# Patient Record
Sex: Male | Born: 1975 | Race: White | Hispanic: No | Marital: Married | Smoking: Former smoker
Health system: Southern US, Community
[De-identification: ages and names within clinical notes are randomized; demographics above are authoritative.]

## PROBLEM LIST (undated history)

## (undated) DIAGNOSIS — M069 Rheumatoid arthritis, unspecified: Secondary | ICD-10-CM

## (undated) DIAGNOSIS — J309 Allergic rhinitis, unspecified: Secondary | ICD-10-CM

## (undated) DIAGNOSIS — K219 Gastro-esophageal reflux disease without esophagitis: Secondary | ICD-10-CM

## (undated) DIAGNOSIS — F419 Anxiety disorder, unspecified: Secondary | ICD-10-CM

## (undated) DIAGNOSIS — J45909 Unspecified asthma, uncomplicated: Secondary | ICD-10-CM

## (undated) HISTORY — DX: Unspecified asthma, uncomplicated: J45.909

## (undated) HISTORY — PX: WISDOM TOOTH EXTRACTION: SHX21

## (undated) HISTORY — DX: Rheumatoid arthritis, unspecified: M06.9

## (undated) HISTORY — DX: Allergic rhinitis, unspecified: J30.9

## (undated) HISTORY — DX: Anxiety disorder, unspecified: F41.9

## (undated) HISTORY — DX: Gastro-esophageal reflux disease without esophagitis: K21.9

---

## 2015-07-01 ENCOUNTER — Encounter: Payer: Self-pay | Admitting: Family Medicine

## 2015-07-01 ENCOUNTER — Ambulatory Visit (INDEPENDENT_AMBULATORY_CARE_PROVIDER_SITE_OTHER): Payer: BLUE CROSS/BLUE SHIELD | Admitting: Family Medicine

## 2015-07-01 VITALS — BP 119/83 | HR 71 | Temp 98.3°F | Resp 20 | Ht 72.0 in | Wt 200.2 lb

## 2015-07-01 DIAGNOSIS — Z79620 Long term (current) use of immunosuppressive biologic: Secondary | ICD-10-CM | POA: Insufficient documentation

## 2015-07-01 DIAGNOSIS — J0100 Acute maxillary sinusitis, unspecified: Secondary | ICD-10-CM | POA: Diagnosis not present

## 2015-07-01 DIAGNOSIS — Z7189 Other specified counseling: Secondary | ICD-10-CM

## 2015-07-01 DIAGNOSIS — Z79899 Other long term (current) drug therapy: Secondary | ICD-10-CM

## 2015-07-01 DIAGNOSIS — Z7689 Persons encountering health services in other specified circumstances: Secondary | ICD-10-CM | POA: Insufficient documentation

## 2015-07-01 MED ORDER — AMOXICILLIN-POT CLAVULANATE 875-125 MG PO TABS: 1.0000 | ORAL_TABLET | Freq: Two times a day (BID) | ORAL | Status: DC

## 2015-07-01 NOTE — Patient Instructions (Signed)
Sinusitis, Adult Sinusitis is redness, soreness, and inflammation of the paranasal sinuses. Paranasal sinuses are air pockets within the bones of your face. They are located beneath your eyes, in the middle of your forehead, and above your eyes. In healthy paranasal sinuses, mucus is able to drain out, and air is able to circulate through them by way of your nose. However, when your paranasal sinuses are inflamed, mucus and air can become trapped. This can allow bacteria and other germs to grow and cause infection. Sinusitis can develop quickly and last only a short time (acute) or continue over a long period (chronic). Sinusitis that lasts for more than 12 weeks is considered chronic. CAUSES Causes of sinusitis include:  Allergies.  Structural abnormalities, such as displacement of the cartilage that separates your nostrils (deviated septum), which can decrease the air flow through your nose and sinuses and affect sinus drainage.  Functional abnormalities, such as when the small hairs (cilia) that line your sinuses and help remove mucus do not work properly or are not present. SIGNS AND SYMPTOMS Symptoms of acute and chronic sinusitis are the same. The primary symptoms are pain and pressure around the affected sinuses. Other symptoms include:  Upper toothache.  Earache.  Headache.  Bad breath.  Decreased sense of smell and taste.  A cough, which worsens when you are lying flat.  Fatigue.  Fever.  Thick drainage from your nose, which often is green and may contain pus (purulent).  Swelling and warmth over the affected sinuses. DIAGNOSIS Your health care provider will perform a physical exam. During your exam, your health care provider may perform any of the following to help determine if you have acute sinusitis or chronic sinusitis:  Look in your nose for signs of abnormal growths in your nostrils (nasal polyps).  Tap over the affected sinus to check for signs of  infection.  View the inside of your sinuses using an imaging device that has a light attached (endoscope). If your health care provider suspects that you have chronic sinusitis, one or more of the following tests may be recommended:  Allergy tests.  Nasal culture. A sample of mucus is taken from your nose, sent to a lab, and screened for bacteria.  Nasal cytology. A sample of mucus is taken from your nose and examined by your health care provider to determine if your sinusitis is related to an allergy. TREATMENT Most cases of acute sinusitis are related to a viral infection and will resolve on their own within 10 days. Sometimes, medicines are prescribed to help relieve symptoms of both acute and chronic sinusitis. These may include pain medicines, decongestants, nasal steroid sprays, or saline sprays. However, for sinusitis related to a bacterial infection, your health care provider will prescribe antibiotic medicines. These are medicines that will help kill the bacteria causing the infection. Rarely, sinusitis is caused by a fungal infection. In these cases, your health care provider will prescribe antifungal medicine. For some cases of chronic sinusitis, surgery is needed. Generally, these are cases in which sinusitis recurs more than 3 times per year, despite other treatments. HOME CARE INSTRUCTIONS  Drink plenty of water. Water helps thin the mucus so your sinuses can drain more easily.  Use a humidifier.  Inhale steam 3-4 times a day (for example, sit in the bathroom with the shower running).  Apply a warm, moist washcloth to your face 3-4 times a day, or as directed by your health care provider.  Use saline nasal sprays to help   moisten and clean your sinuses.  Take medicines only as directed by your health care provider.  If you were prescribed either an antibiotic or antifungal medicine, finish it all even if you start to feel better. SEEK IMMEDIATE MEDICAL CARE IF:  You have  increasing pain or severe headaches.  You have nausea, vomiting, or drowsiness.  You have swelling around your face.  You have vision problems.  You have a stiff neck.  You have difficulty breathing.   This information is not intended to replace advice given to you by your health care provider. Make sure you discuss any questions you have with your health care provider.   Document Released: 04/30/2005 Document Revised: 05/21/2014 Document Reviewed: 05/15/2011 Elsevier Interactive Patient Education 2016 [employer]. flonase daily, Careers adviser daily.  Mucinex augmentin

## 2015-07-01 NOTE — Progress Notes (Signed)
Patient ID: Bruce Knight, male   DOB: 1975/06/13, 40 y.o.   MRN: [id number]    Bruce Knight , January 27, 1976, 40 y.o., male MRN: [id number]  CC: Facial pressure/headache Pt to establish care with acute complaint. No records available prior to appt.   Subjective: Pt presents for an acute OV with complaints of headache, facial pressure, nasal congestion, rhinorrhea and sore throat of 4-5 days in duration. He is tolerating food and drink. He does have a decreased appetite. He denies cough, nausea, vomit, diarrhea, fevers or chills. he is on Flonase and Allegra-D. He states he feels like his symptoms are worsening. Patient is on Humira for rheumatoid arthritis. His last injection was today. He denies asthma history. Unknown vaccination record.  Allergies  Allergen Reactions  . Codeine Other (See Comments)    childhood  . Sulfa Antibiotics Other (See Comments)    childhood allergy   Social History  Substance Use Topics  . Smoking status: Never Smoker   . Smokeless tobacco: Not on file  . Alcohol Use: 1.2 oz/week    2 Cans of beer per week   Past Medical History  Diagnosis Date  . Allergy   . Arthritis    Past Surgical History  Procedure Laterality Date  . Wisdom tooth extraction     Family History  Problem Relation Age of Onset  . Arthritis Mother      Medication List       This list is accurate as of: 07/01/15  2:23 PM.  Always use your most recent med list.               diclofenac 75 MG EC tablet  Commonly known as:  VOLTAREN  TK 1 T PO BID     HUMIRA PEN Bruce Knight  Inject into the skin every 14 (fourteen) days.     omeprazole 40 MG capsule  Commonly known as:  PRILOSEC  TK ONE C PO QAM ONE HALF HOUR BEFORE BREAKFAST       ROS: Negative, with the exception of above mentioned in HPI  Objective:  BP 119/83 mmHg  Pulse 71  Temp(Src) 98.3 F (36.8 C)  Resp 20  Ht 6' (1.829 m)  Wt 200 lb 4 oz (90.833 kg)  BMI 27.15 kg/m2  SpO2 96% Body mass index is 27.15  kg/(m^2). Gen: Afebrile. No acute distress. Nontoxic in appearance, well-developed, well-nourished, Caucasian male. Pleasant. HENT: AT. [place]. Bilateral TM visualized no erythema or bulging. MMM, no oral lesions. Bilateral nares with erythema and swelling. Throat without erythema or exudates. Cobblestoning present. No cough on exam. No hoarseness on exam. Mild tenderness to palpation left maxillary sinus. Eyes:Pupils Equal Round Reactive to light, Extraocular movements intact,  Conjunctiva without redness, discharge or icterus. Neck/lymp/endocrine: Supple, no lymphadenopathy CV: RRR  Chest: CTAB, no wheeze or crackles. Good air movement, normal resp effort.  Abd: Soft. NTND. BS present. Skin: No rashes, purpura or petechiae.  Neuro: Normal gait. PERLA. EOMi. Alert. Oriented x3   Assessment/Plan: Bruce Knight is a 40 y.o. male present for establish care and acute complaint. 1. Acute maxillary sinusitis, recurrence not specified - on Humira, last dose yesterday - flonase and plain Allegra continue daily. Add Mucinex - amoxicillin-clavulanate (AUGMENTIN) 875-125 MG tablet; Take 1 tablet by mouth 2 (two) times daily.  Dispense: 20 tablet; Refill: 0 - F/U PRN for acute illness, will need physical within 3 months. - Records have been requested  Felix Pacini, DO  [facility]- OR

## 2015-07-05 ENCOUNTER — Encounter: Payer: Self-pay | Admitting: Family Medicine

## 2015-07-05 DIAGNOSIS — M069 Rheumatoid arthritis, unspecified: Secondary | ICD-10-CM | POA: Insufficient documentation

## 2015-07-13 ENCOUNTER — Encounter: Payer: Self-pay | Admitting: Family Medicine

## 2015-07-13 ENCOUNTER — Other Ambulatory Visit: Payer: Self-pay | Admitting: Family Medicine

## 2015-09-30 ENCOUNTER — Encounter: Payer: Self-pay | Admitting: Family Medicine

## 2015-09-30 DIAGNOSIS — E663 Overweight: Secondary | ICD-10-CM | POA: Insufficient documentation

## 2015-09-30 DIAGNOSIS — K219 Gastro-esophageal reflux disease without esophagitis: Secondary | ICD-10-CM | POA: Insufficient documentation

## 2015-12-09 ENCOUNTER — Encounter: Payer: Self-pay | Admitting: Family Medicine

## 2015-12-09 ENCOUNTER — Ambulatory Visit (INDEPENDENT_AMBULATORY_CARE_PROVIDER_SITE_OTHER): Payer: BLUE CROSS/BLUE SHIELD | Admitting: Family Medicine

## 2015-12-09 VITALS — BP 123/83 | HR 56 | Temp 98.0°F | Resp 20 | Ht 72.0 in | Wt 201.8 lb

## 2015-12-09 DIAGNOSIS — K625 Hemorrhage of anus and rectum: Secondary | ICD-10-CM | POA: Diagnosis not present

## 2015-12-09 DIAGNOSIS — K648 Other hemorrhoids: Secondary | ICD-10-CM | POA: Diagnosis not present

## 2015-12-09 DIAGNOSIS — K602 Anal fissure, unspecified: Secondary | ICD-10-CM

## 2015-12-09 LAB — POC HEMOCCULT BLD/STL (OFFICE/1-CARD/DIAGNOSTIC): Fecal Occult Blood, POC: POSITIVE — AB

## 2015-12-09 MED ORDER — HYDROCORTISONE ACETATE 25 MG RE SUPP: 25.0000 mg | Freq: Two times a day (BID) | RECTAL | 0 refills | Status: DC

## 2015-12-09 MED ORDER — HYDROCORTISONE 2.5 % RE CREA: 1.0000 "application " | TOPICAL_CREAM | Freq: Two times a day (BID) | RECTAL | 0 refills | Status: DC

## 2015-12-09 NOTE — Progress Notes (Signed)
Bruce Knight , [birth year], 40 y.o., male MRN: [id number] Patient Care Team    Relationship Specialty Notifications Start End  Natalia Leatherwood, DO PCP - General Family Medicine  07/01/15   Fuller Mandril, MD Referring Physician Rheumatology  07/01/15     CC: rectal bleeding Subjective: Pt presents for an acute OV with complaints of rectal bleeding of 3 days duration.  Associated symptoms include constipation and mildly painful stools. He states 2-3 months ago he noticed a discomfort with passing stool and  small amount bright red blood on toilet paper. He has been placing triple abx ointment on the area. He denies fever, chills, abd pain, discomfort sitting or unintentional weight loss. He denies stool discoloration/blood. Patients has a h/o rheumatoid arthritis and is prescribed humira. He is prescribed  diclofenac as needed. No Fhx of colon cancer or disease. He states he has be working out more and drinking a protein powder.   Allergies  Allergen Reactions  . Codeine Other (See Comments)    childhood  . Latex   . Sulfa Antibiotics Other (See Comments)    childhood allergy   Social History  Substance Use Topics  . Smoking status: Former Games developer  . Smokeless tobacco: Never Used  . Alcohol use 1.2 oz/week    2 Cans of beer per week   Past Medical History:  Diagnosis Date  . Allergic rhinitis   . Allergy   . Anxiety   . Asthma   . GERD (gastroesophageal reflux disease)   . Rheumatoid arthritis [facility])    Past Surgical History:  Procedure Laterality Date  . WISDOM TOOTH EXTRACTION     Family History  Problem Relation Age of Onset  . Arthritis Mother   . Colon cancer Neg Hx   . Prostate cancer Neg Hx      Medication List       Accurate as of 12/09/15  8:42 AM. Always use your most recent med list.          diclofenac 75 MG EC tablet Commonly known as:  VOLTAREN TK 1 T PO BID   HUMIRA PEN [place] Inject into the skin every 14 (fourteen) days.   omeprazole 40 MG  capsule Commonly known as:  PRILOSEC TK ONE C PO QAM ONE HALF HOUR BEFORE BREAKFAST       No results found for this or any previous visit (from the past 24 hour(s)). No results found.   ROS: Negative, with the exception of above mentioned in HPI   Objective:  BP 123/83 (BP Location: Right Arm, Patient Position: Sitting, Cuff Size: Large)   Pulse (!) 56   Temp 98 F (36.7 C) (Oral)   Resp 20   Ht 6' (1.829 m)   Wt 201 lb 12 oz (91.5 kg)   SpO2 97%   BMI 27.36 kg/m  Body mass index is 27.36 kg/m. Gen: Afebrile. No acute distress. Nontoxic in appearance, well developed, well nourished. Pleasant caucasian male.  HENT: AT. [place]. MMM, no oral lesions. Eyes:Pupils Equal Round Reactive to light, Extraocular movements intact,  Conjunctiva without redness, discharge or icterus. GU: Mild erythema and irritation perianal region. No external mass/hemorrhoid, small skin break, possible small fissure anteriorly, normal anal tone, small internal hemorrhoid present. No obvious rectal masses.  FOBT: positive  Assessment/Plan: Bruce Knight is a 40 y.o. male present for acute OV for rectal bleeding.   Rectal bleeding/internal hemorrhoid/anal fissure - POC Hemoccult Bld/Stl (1-Cd Office Dx): Positive - Discussed hemorrhoid treatment,  sitz bath, increase fiber 20-35 g daily. Increase water consumption to 80-100 ounces daily, more if working out/sweating. Miralax use to soften stools, without causing watery stools or diarrhea.  - Avoidance of NSAIDS - hydrocortisone (ANUSOL-HC) 25 MG suppository; Place 1 suppository (25 mg total) rectally 2 (two) times daily.  Dispense: 12 suppository; Refill: 0 - hydrocortisone (ANUSOL-HC) 2.5 % rectal cream; Place 1 application rectally 2 (two) times daily.  Dispense: 30 g; Refill: 0 - If bleeding is not resolved, pt is to make an appt. R/O IBD as cause. Consider GI referral with RA h/o concern for IBD development.   electronically signed by:  Felix Pacini, DO   [facility] - OR

## 2015-12-09 NOTE — Patient Instructions (Addendum)
About Hemorrhoids  Hemorrhoids are swollen veins in the lower rectum and anus.  Also called piles, hemorrhoids are a common problem.  Hemorrhoids may be internal (inside the rectum) or external (around the anus).  Internal Hemorrhoids  Internal hemorrhoids are often painless, but they rarely cause bleeding.  The internal veins may stretch and fall down (prolapse) through the anus to the outside of the body.  The veins may then become irritated and painful.  External Hemorrhoids  External hemorrhoids can be easily seen or felt around the anal opening.  They are under the skin around the anus.  When the swollen veins are scratched or broken by straining, rubbing or wiping they sometimes bleed.  How Hemorrhoids Occur  Veins in the rectum and around the anus tend to swell under pressure.  Hemorrhoids can result from increased pressure in the veins of your anus or rectum.  Some sources of pressure are:   Straining to have a bowel movement because of constipation  Waiting too long to have a bowel movement  Coughing and sneezing often  Sitting for extended periods of time, including on the toilet  Diarrhea  Obesity  Trauma or injury to the anus  Some liver diseases  Stress  Family history of hemorrhoids  Pregnancy  Pregnant women should try to avoid becoming constipated, because they are more likely to have hemorrhoids during pregnancy.  In the last trimester of pregnancy, the enlarged uterus may press on blood vessels and causes hemorrhoids.  In addition, the strain of childbirth sometimes causes hemorrhoids after the birth.  Symptoms of Hemorrhoids  Some symptoms of hemorrhoids include:  Swelling and/or a tender lump around the anus  Itching, mild burning and bleeding around the anus  Painful bowel movements with or without constipation  Bright red blood covering the stool, on toilet paper or in the toilet bowel.   Symptoms usually go away within a few days.  Always  talk to your doctor about any bleeding to make sure it is not from some other causes.  Diagnosing and Treating Hemorrhoids  Diagnosis is made by an examination by your healthcare provider.  Special test can be performed by your doctor.    Most cases of hemorrhoids can be treated with:  High-fiber diet: Eat more high-fiber foods, which help prevent constipation.  Ask for more detailed fiber information on types and sources of fiber from your healthcare provider.  Fluids: Drink plenty of water.  This helps soften bowel movements so they are easier to pass.  Sitz baths and cold packs: Sitting in lukewarm water two or three times a day for 15 minutes cleases the anal area and may relieve discomfort.  If the water is too hot, swelling around the anus will get worse.  Placing a cloth-covered ice pack on the anus for ten minutes four times a day can also help reduce selling.  Gently pushing a prolapsed hemorrhoid back inside after the bath or ice pack can be helpful.  Medications: For mild discomfort, your healthcare provider may suggest over-the-counter pain medication or prescribe a cream or ointment for topical use.  The cream may contain witch hazel, zinc oxide or petroleum jelly.  Medicated suppositories are also a treatment option.  Always consult your doctor before applying medications or creams.  Procedures and surgeries: There are also a number of procedures and surgeries to shrink or remove hemorrhoids in more serious cases.  Talk to your physician about these options.  You can often prevent hemorrhoids or keep   them from becoming worse by maintaining a healthy lifestyle.  Eat a fiber-rich diet of fruits, vegetables and whole grains.  Also, drink plenty of water and exercise regularly.   2007, [employer] Doc.30  Increase your Water consumption, at least 80 -100 ounces a day.  Look at picking up a sitz bath at pharmacy to help with cleaning.  Stop antibiotic cream  No  NSAIDS Increase fiber to 20-35 g daily.  Use miralax 1 cap with 8 ounces of water daily to soften stool, without causing loose stool .

## 2016-06-29 ENCOUNTER — Telehealth: Payer: Self-pay | Admitting: Family Medicine

## 2016-06-29 ENCOUNTER — Encounter: Payer: Self-pay | Admitting: *Deleted

## 2016-06-29 NOTE — Telephone Encounter (Signed)
Do you want to refill his omeprazole and diclofenac we have never Rx'd these medications and he has requested to change to another PCP Please advise

## 2016-06-29 NOTE — Telephone Encounter (Signed)
Patient is calling to request to switch PCP's.   He would like to change from Dr. Felix Pacini at [facility] to Dr. Helane Rima at [facility].  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 Earlene Plater.  Please respond at your earliest  convenience to acknowledge the patients request.  Thank you,  -LL

## 2016-06-29 NOTE — Telephone Encounter (Signed)
Message sent in MyChart.

## 2016-06-29 NOTE — Telephone Encounter (Signed)
It ok with me. I am not certain if Dr. Earlene Plater performs DOTC physicals though. He may want to check to make sure.

## 2016-06-29 NOTE — Telephone Encounter (Signed)
I have never prescribed these medicines for him prior. He should contact the provider that prescribed the meds for him.

## 2016-06-29 NOTE — Telephone Encounter (Signed)
**  Remind patient they can make refill requests via MyChart**  Medication refill request (Name & Dosage): omeprozole 40 mg and diclofenac 75 mg   Preferred [facility] ([facility] & [facility] [address]     Other comments (if applicable):

## 2016-06-29 NOTE — Telephone Encounter (Signed)
Okay to transfer. 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

## 2016-07-06 ENCOUNTER — Encounter: Payer: Self-pay | Admitting: Family Medicine

## 2016-07-06 ENCOUNTER — Ambulatory Visit (INDEPENDENT_AMBULATORY_CARE_PROVIDER_SITE_OTHER): Payer: BLUE CROSS/BLUE SHIELD | Admitting: Family Medicine

## 2016-07-06 VITALS — BP 136/78 | HR 62 | Temp 98.5°F | Ht 71.0 in | Wt 202.2 lb

## 2016-07-06 DIAGNOSIS — M05741 Rheumatoid arthritis with rheumatoid factor of right hand without organ or systems involvement: Secondary | ICD-10-CM

## 2016-07-06 DIAGNOSIS — K219 Gastro-esophageal reflux disease without esophagitis: Secondary | ICD-10-CM

## 2016-07-06 DIAGNOSIS — M05742 Rheumatoid arthritis with rheumatoid factor of left hand without organ or systems involvement: Secondary | ICD-10-CM

## 2016-07-06 DIAGNOSIS — L720 Epidermal cyst: Secondary | ICD-10-CM | POA: Diagnosis not present

## 2016-07-06 DIAGNOSIS — E663 Overweight: Secondary | ICD-10-CM | POA: Diagnosis not present

## 2016-07-06 MED ORDER — DICLOFENAC SODIUM 75 MG PO TBEC: 75.0000 mg | DELAYED_RELEASE_TABLET | Freq: Two times a day (BID) | ORAL | 3 refills | Status: DC

## 2016-07-06 MED ORDER — OMEPRAZOLE 40 MG PO CPDR: 40.0000 mg | DELAYED_RELEASE_CAPSULE | Freq: Every day | ORAL | 3 refills | Status: DC

## 2016-07-06 NOTE — Progress Notes (Signed)
Bruce Knight is a 41 y.o. male is here to [facility] C[facility].   History of Present Illness:    1. Rheumatoid arthritis involving both hands with positive rheumatoid factor (HCC). Followed by Rheumatology. Doing well on Humira and Diclofenac.     2. Gastroesophageal reflux disease without esophagitis. Doing well on Prilosec. Gastrointestinal ROS: no abdominal pain, change in bowel habits, or black or bloody stools.    3. Overweight (BMI 25.0-29.9).   Wt Readings from Last 3 Encounters:  07/06/16 202 lb 3.2 oz (91.7 kg)  12/09/15 201 lb 12 oz (91.5 kg)  07/01/15 200 lb 4 oz (90.8 kg)    4.      Cyst. On back. No change in 2 years. No symptoms. Wants checked.   There are no preventive care reminders to display for this patient.  PMHx, SurgHx, SocialHx, Medications, and Allergies were reviewed in the Visit Navigator and updated as appropriate.    Past Medical History:  Diagnosis Date  . Allergic rhinitis   . Anxiety   . Asthma   . GERD (gastroesophageal reflux disease)   . Rheumatoid arthritis [facility])     Past Surgical History:  Procedure Laterality Date  . WISDOM TOOTH EXTRACTION      Family History  Problem Relation Age of Onset  . Arthritis Mother   . Prostate cancer Father   . Colon cancer Neg Hx     Social History  Substance Use Topics  . Smoking status: Former Games developer  . Smokeless tobacco: Never Used  . Alcohol use 1.2 oz/week    2 Cans of beer per week     Comment: Socially     Current Medications and Allergies:    Current Outpatient Prescriptions:  .  Adalimumab (HUMIRA PEN [place]), Inject into the skin every 14 (fourteen) days., Disp: , Rfl:  .  diclofenac (VOLTAREN) 75 MG EC tablet, Take 1 tablet (75 mg total) by mouth 2 (two) times daily., Disp: 180 tablet, Rfl: 3 .  omeprazole (PRILOSEC) 40 MG capsule, Take 1 capsule (40 mg total) by mouth daily., Disp: 90 capsule, Rfl: 3   Allergies  Allergen Reactions  . Codeine Other (See Comments)    childhood    . Latex   . Sulfa Antibiotics Other (See Comments)    childhood allergy      Patient Information Form: Screening and ROS    Do you feel safe in relationships? yes PHQ-2: negative  Review of Systems  General:  Negative for unexplained weight loss, fever Skin: Negative for new or changing mole, sore that won't heal HEENT: Negative for trouble hearing, trouble seeing, ringing in ears, mouth sores, hoarseness, change in voice, dysphagia CV:  Negative for chest pain, dyspnea, edema, palpitations Resp: Negative for cough, dyspnea, hemoptysis GI: Negative for nausea, vomiting, diarrhea, constipation, abdominal pain, melena, hematochezia GU: Negative for dysuria, incontinence, urinary hesitance, hematuria, vaginal or penile discharge, polyuria, sexual difficulty, lumps in testicle or breasts MSK: Negative for muscle cramps or aches Neuro: Negative for headaches, weakness, numbness, dizziness, passing out/fainting Psych: Negative for depression, anxiety, memory problems   Vitals:   Vitals:   07/06/16 0818  BP: 136/78  Pulse: 62  Temp: 98.5 F (36.9 C)  TempSrc: Oral  SpO2: 97%  Weight: 202 lb 3.2 oz (91.7 kg)  Height: 5\' 11"  (1.803 m)     Body mass index is 28.2 kg/m.   Physical Exam:     General: Alert, cooperative, appears stated age and no distress.  HEENT:  Normocephalic, without obvious abnormality, atraumatic. Conjunctivae/corneas clear. PERRL, EOM's intact. Normal TM's and external ear canals both ears. Nares normal. Septum midline. Mucosa normal. No drainage or sinus tenderness. Lips, mucosa, and tongue normal; teeth and gums normal.  Lungs: Clear to auscultation bilaterally.  Heart:: Regular rate and rhythm, S1, S2 normal, no murmur, click, rub or gallop.  Abdomen: Soft, non-tender; bowel sounds normal; no masses,  no organomegaly.  Extremities: Extremities normal, atraumatic, no cyanosis or edema.  Pulses: 2+ and symmetric.  Skin: Skin color, texture, turgor  normal. 9 mm diameter, flesh-colored, mobile, non-tender, cyst.  Neurologic: Alert and oriented X 3, normal strength and tone. Normal symmetric. reflexes. Normal coordination and gait.  Psych: Alert,oriented, in NAD with a full range of affect, normal behavior and no psychotic features      Assessment and Plan:    Bruce Knight was seen today for establish care and medication refill.  Diagnoses and all orders for this visit:  Rheumatoid arthritis involving both hands with positive rheumatoid factor (HCC) -     diclofenac (VOLTAREN) 75 MG EC tablet; Take 1 tablet (75 mg total) by mouth 2 (two) times daily.  Gastroesophageal reflux disease without esophagitis -     omeprazole (PRILOSEC) 40 MG capsule; Take 1 capsule (40 mg total) by mouth daily.  Overweight (BMI 25.0-29.9) Comments: The patient is asked to make an attempt to improve diet and exercise patterns.  Epidermoid cyst       -      Benign-appearing today. Red flags reviewed.  . Reviewed expectations re: course of current medical issues. . Discussed self-management of symptoms. . Outlined signs and symptoms indicating need for more acute intervention. . Patient verbalized understanding and all questions were answered. . See orders for this visit as documented in the electronic medical record. . Patient received an After Visit Summary.  Records requested if needed. I spent 45 minutes with this patient, greater than 50% was face-to-face time counseling regarding the above diagnoses.    Helane Rima, D.O. [place] 07/06/2016   Follow-up: No Follow-up on file.  Meds ordered this encounter  Medications  . diclofenac (VOLTAREN) 75 MG EC tablet    Sig: Take 1 tablet (75 mg total) by mouth 2 (two) times daily.    Dispense:  180 tablet    Refill:  3  . omeprazole (PRILOSEC) 40 MG capsule    Sig: Take 1 capsule (40 mg total) by mouth daily.    Dispense:  90 capsule    Refill:  3   Medications Discontinued  During This Encounter  Medication Reason  . diclofenac (VOLTAREN) 75 MG EC tablet Reorder  . omeprazole (PRILOSEC) 40 MG capsule Reorder   No orders of the defined types were placed in this encounter.

## 2016-07-06 NOTE — Progress Notes (Signed)
Pre visit review using our clinic review tool, if applicable. No additional management support is needed unless otherwise documented below in the visit note. 

## 2017-01-30 ENCOUNTER — Telehealth: Payer: Self-pay | Admitting: Family Medicine

## 2017-01-30 NOTE — Telephone Encounter (Signed)
Sched acute visit w/ Earlene Plater tomorrow at 8:15am for weakness, lightheaded (not dizzy), nose running, sinus pressure.  -LL

## 2017-01-31 ENCOUNTER — Ambulatory Visit (INDEPENDENT_AMBULATORY_CARE_PROVIDER_SITE_OTHER): Payer: BLUE CROSS/BLUE SHIELD | Admitting: Family Medicine

## 2017-01-31 ENCOUNTER — Encounter: Payer: Self-pay | Admitting: Family Medicine

## 2017-01-31 VITALS — BP 132/78 | HR 61 | Temp 98.3°F | Ht 71.0 in | Wt 200.8 lb

## 2017-01-31 DIAGNOSIS — J208 Acute bronchitis due to other specified organisms: Secondary | ICD-10-CM | POA: Diagnosis not present

## 2017-01-31 DIAGNOSIS — J0100 Acute maxillary sinusitis, unspecified: Secondary | ICD-10-CM

## 2017-01-31 DIAGNOSIS — B9689 Other specified bacterial agents as the cause of diseases classified elsewhere: Secondary | ICD-10-CM

## 2017-01-31 MED ORDER — PREDNISONE 5 MG PO TABS
ORAL_TABLET | ORAL | 0 refills | Status: DC
Start: 2017-01-31 — End: 2017-10-22

## 2017-01-31 MED ORDER — DOXYCYCLINE HYCLATE 100 MG PO TABS: 100.0000 mg | ORAL_TABLET | Freq: Two times a day (BID) | ORAL | 0 refills | Status: DC

## 2017-01-31 NOTE — Progress Notes (Signed)
Bruce Knight is a 41 y.o. male here for an acute visit.  History of Present Illness:   Insurance claims handler, CMA, acting as scribe for Dr. Earlene Plater.  HPI: The patient presets with complaints of several days of sinus pain and pressure, associated with congestion and PND. This is also now associated with central lung congestion. He denies fever, chills, CP, SOB, wheeze, N/V/D/C, edema, or rash. Has tried OTC medications with no relief. Nonsmoker. Family members have also been sick.   PMHx, SurgHx, SocialHx, Medications, and Allergies were reviewed in the Visit Navigator and updated as appropriate.  Current Medications:   .  Adalimumab (HUMIRA PEN [place]), Inject into the skin every 14 (fourteen) days., Disp: , Rfl:  .  diclofenac (VOLTAREN) 75 MG EC tablet, Take 1 tablet (75 mg total) by mouth 2 (two) times daily., Disp: 180 tablet, Rfl: 3 .  omeprazole (PRILOSEC) 40 MG capsule, Take 1 capsule (40 mg total) by mouth daily., Disp: 90 capsule, Rfl: 3   Allergies  Allergen Reactions  . Codeine Other (See Comments)    Childhood allergy  . Latex   . Sulfa Antibiotics Other (See Comments)    childhood allergy   Review of Systems:   Pertinent items are noted in the HPI. Otherwise, ROS is negative.  Vitals:   Vitals:   01/31/17 0806  BP: 132/78  Pulse: 61  Temp: 98.3 F (36.8 C)  TempSrc: Oral  SpO2: 97%  Weight: 200 lb 12.8 oz (91.1 kg)  Height: 5\' 11"  (1.803 m)     Body mass index is 28.01 kg/m.   Physical Exam:   Physical Exam  Constitutional: He is oriented to person, place, and time. He appears well-developed and well-nourished. No distress.  HENT:  Head: Normocephalic and atraumatic.  Nose: Rhinorrhea present. Right sinus exhibits maxillary sinus tenderness. Left sinus exhibits maxillary sinus tenderness.  Eyes: Pupils are equal, round, and reactive to light. Conjunctivae and EOM are normal.  Neck: Normal range of motion. Neck supple.  Cardiovascular: Normal rate, regular  rhythm, normal heart sounds and intact distal pulses.   Pulmonary/Chest: Effort normal. He has no wheezes. He has no rhonchi.  Abdominal: Soft. Bowel sounds are normal.  Musculoskeletal: Normal range of motion.  Neurological: He is alert and oriented to person, place, and time.  Skin: Skin is warm and dry.  Psychiatric: He has a normal mood and affect. His behavior is normal. Judgment and thought content normal.  Nursing note and vitals reviewed.  Assessment and Plan:   Bruce Knight was seen today for acute visit.  Diagnoses and all orders for this visit:  Acute non-recurrent maxillary sinusitis Comments: Discussed watchful waiting with symptomatic care versus treatment in this patient on Humira. After reviewing benefits and risks, we both agree that it would be safe and beneficial to treat today. Red flags reviewed. Orders: -     doxycycline (VIBRA-TABS) 100 MG tablet; Take 1 tablet (100 mg total) by mouth 2 (two) times daily.  Acute bacterial bronchitis -     predniSONE (DELTASONE) 5 MG tablet; 6-5-4-3-2-1-off   . Reviewed expectations re: course of current medical issues. . Discussed self-management of symptoms. . Outlined signs and symptoms indicating need for more acute intervention. . Patient verbalized understanding and all questions were answered. Onalee Hua Health Maintenance issues including appropriate healthy diet, exercise, and smoking avoidance were discussed with patient. . See orders for this visit as documented in the electronic medical record. . Patient received an After Visit Summary.  CMA served  as scribe during this visit. History, Physical, and Plan performed by medical provider. The above documentation has been reviewed and is accurate and complete. Helane Rima, D.O.  Helane Rima, DO [place], [facility] 01/31/2017

## 2017-06-26 ENCOUNTER — Encounter: Payer: Self-pay | Admitting: Family Medicine

## 2017-09-24 ENCOUNTER — Other Ambulatory Visit: Payer: Self-pay | Admitting: Family Medicine

## 2017-09-24 DIAGNOSIS — K219 Gastro-esophageal reflux disease without esophagitis: Secondary | ICD-10-CM

## 2017-10-22 ENCOUNTER — Ambulatory Visit: Payer: Managed Care, Other (non HMO) | Admitting: Family Medicine

## 2017-10-22 ENCOUNTER — Encounter: Payer: Self-pay | Admitting: Family Medicine

## 2017-10-22 VITALS — BP 118/86 | HR 62 | Temp 98.2°F | Ht 71.0 in | Wt 198.4 lb

## 2017-10-22 DIAGNOSIS — H6983 Other specified disorders of Eustachian tube, bilateral: Secondary | ICD-10-CM

## 2017-10-22 DIAGNOSIS — L723 Sebaceous cyst: Secondary | ICD-10-CM

## 2017-10-22 DIAGNOSIS — J302 Other seasonal allergic rhinitis: Secondary | ICD-10-CM

## 2017-10-22 MED ORDER — AZELASTINE-FLUTICASONE 137-50 MCG/ACT NA SUSP: NASAL | 0 refills | Status: DC

## 2017-10-22 MED ORDER — PREDNISONE 50 MG PO TABS: ORAL_TABLET | ORAL | 0 refills | Status: DC

## 2017-10-22 NOTE — Patient Instructions (Signed)
It was very nice to see you today!  You have fluid behind your ears.  This could be causing her symptoms.  Please start the nasal spray and the prednisone.  Please stay well-hydrated.  Let me know or let Dr. Earlene Plater know if your symptoms worsen or do not improve over the next few days.  Please also let us know if you start to have severe pain, pressure, congestion, or fevers.  We can remove the cyst on your back if you wish. Please let me know if you would like this done.   Take care, Dr Jimmey Ralph

## 2017-10-22 NOTE — Progress Notes (Signed)
   Subjective:  Bruce Knight is a 42 y.o. male who presents today for same-day appointment with a chief complaint of dizziness.   HPI:  Dizziness, acute problem Symptoms started yesterday and have worsened yesterday evening into today.  Patient describes a sensation of feeling "off balance" when turning his head from right to left and from left to right.  He does not have any symptoms when looking straight ahead.  Patient is concerned that he may have a sinus infection as this is the same symptoms he has had previously with past sinus infections.  He denies any room spinning sensation.  No fever or chills.  No facial pain.  He has had a little bit of associated ear pressure and sore throat.  Also with postnasal drip that he associates with chronic seasonal allergies.  He has tried taking over-the-counter medications including Allegra-D and Flonase which have not significantly seem to help.  No other obvious alleviating or aggravating factors.  No weakness or numbness.  No reported syncope.  Sebaceous cyst, chronic problem, new to provider Patient with several year history of cyst on his right upper back.  This will occasionally drain white, thick material.  ROS: Per HPI  PMH: He reports that he has quit smoking. He has never used smokeless tobacco. He reports that he drinks about 1.2 oz of alcohol per week. He reports that he does not use drugs.  Objective:  Physical Exam: BP 118/86 (BP Location: Left Arm, Patient Position: Sitting, Cuff Size: Normal)   Pulse 62   Temp 98.2 F (36.8 C) (Oral)   Ht 5\' 11"  (1.803 m)   Wt 198 lb 6.4 oz (90 kg)   SpO2 99%   BMI 27.67 kg/m   Gen: NAD, resting comfortably HEENT: TMs with effusion bilaterally.  Nasal mucosa boggy and erythematous bilaterally with small amount of clear nasal discharge.  Oropharynx erythematous.  Maxillary sinuses with normal transillumination bilaterally. CV: RRR with no murmurs appreciated Pulm: NWOB, CTAB with no crackles,  wheezes, or rhonchi Skin: Small 6-7 mm cystic lesion on right upper back. Neuro: Cranial nerves II through XII intact.  Strength 5 out of 5 in upper lower extremities.  Sensation light touch intact throughout. Psych: Normal affect and thought content  Assessment/Plan:  Eustachian tube dysfunction/seasonal allergies Likely the source of patient's disequilibrium.  He does not have any red flag signs or symptoms and his neurological exam today is normal.  Does not have any true vertiginous symptoms.  We will treat with Dymista nasal spray.  We will also give short course of prednisone.  He will continue taking his over-the-counter antihistamines as well.  Do not think he would benefit from antibiotic at this point as he does not have any signs of sinus infection.  Discussed reasons to return to care.  Follow-up as needed.  Sebaceous cyst No red flag signs or symptoms.  Advised patient to come back for excision when he is ready.  . Katina Degree, MD 10/22/2017 4:21 PM

## 2017-10-28 ENCOUNTER — Ambulatory Visit: Payer: Self-pay | Admitting: *Deleted

## 2017-10-28 ENCOUNTER — Ambulatory Visit: Payer: Managed Care, Other (non HMO) | Admitting: Family Medicine

## 2017-10-28 ENCOUNTER — Encounter: Payer: Self-pay | Admitting: Family Medicine

## 2017-10-28 VITALS — BP 114/72 | HR 66 | Temp 98.4°F | Ht 71.0 in | Wt 200.0 lb

## 2017-10-28 DIAGNOSIS — R42 Dizziness and giddiness: Secondary | ICD-10-CM | POA: Diagnosis not present

## 2017-10-28 LAB — GLUCOSE, POCT (MANUAL RESULT ENTRY): POC Glucose: 76 mg/dl (ref 70–99)

## 2017-10-28 MED ORDER — MECLIZINE HCL 25 MG PO CHEW: 1.0000 | CHEWABLE_TABLET | Freq: Three times a day (TID) | ORAL | 0 refills | Status: DC | PRN

## 2017-10-28 MED ORDER — AZITHROMYCIN 250 MG PO TABS: ORAL_TABLET | ORAL | 0 refills | Status: DC

## 2017-10-28 MED ORDER — FLUTICASONE PROPIONATE 50 MCG/ACT NA SUSP: 2.0000 | Freq: Every day | NASAL | 6 refills | Status: DC

## 2017-10-28 NOTE — Telephone Encounter (Signed)
See note

## 2017-10-28 NOTE — Telephone Encounter (Signed)
FYI

## 2017-10-28 NOTE — Patient Instructions (Signed)

## 2017-10-28 NOTE — Telephone Encounter (Signed)
Pt called to report dizziness unresolved; seen 10/22/17, "Fluid behind ears." Placed on prednisone and Dymista nasal spray which pt states has taken as ordered.  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 Appt made for today with Dr. Earlene Plater. Care advise given per protocol.  Reason for Disposition . [1] MODERATE dizziness (e.g., interferes with normal activities) AND [2] has been evaluated by physician for this  Answer Assessment - Initial Assessment Questions 1. DESCRIPTION: "Describe your dizziness."     Intermittent, mild, positional at times 2. LIGHTHEADED: "Do you feel lightheaded?" (e.g., somewhat faint, woozy, weak upon standing)     At times 3. VERTIGO: "Do you feel like either you or the room is spinning or tilting?" (i.e. vertigo)     no 4. SEVERITY: "How bad is it?"  "Do you feel like you are going to faint?" "Can you stand and walk?"   - MILD - walking normally   - MODERATE - interferes with normal activities (e.g., work, school)    - SEVERE - unable to stand, requires support to walk, feels like passing out now.      Seen 10/22/17, unresolved. Instructed to call back if symptoms remain. 5. ONSET:  "When did the dizziness begin?"     Seen 10/22/17 6. AGGRAVATING FACTORS: "Does anything make it worse?" (e.g., standing, change in head position)     Turning head fast 7. HEART RATE: "Can you tell me your heart rate?" "How many beats in 15 seconds?"  (Note: not all patients can do this)        8. CAUSE: "What do you think is causing the dizziness?"     Ears possibly 9. RECURRENT SYMPTOM: "Have you had dizziness before?" If so, ask: "When was the last time?" "What happened that time?"     10/22/17, fluid behind ears 10. OTHER SYMPTOMS: "Do you have any other symptoms?" (e.g., fever, chest pain, vomiting, diarrhea, bleeding)  no  Protocols used: DIZZINESS [facility]

## 2017-10-29 LAB — COMPREHENSIVE METABOLIC PANEL
ALT: 30 U/L (ref 0–53)
AST: 13 U/L (ref 0–37)
Albumin: 4 g/dL (ref 3.5–5.2)
Alkaline Phosphatase: 60 U/L (ref 39–117)
BUN: 18 mg/dL (ref 6–23)
CO2: 29 mEq/L (ref 19–32)
Calcium: 9.2 mg/dL (ref 8.4–10.5)
Chloride: 103 mEq/L (ref 96–112)
Creatinine, Ser: 1.12 mg/dL (ref 0.40–1.50)
GFR: 77.17 mL/min (ref >60.00)
Glucose, Bld: 82 mg/dL (ref 70–99)
Potassium: 4 mEq/L (ref 3.5–5.1)
Sodium: 140 mEq/L (ref 135–145)
Total Bilirubin: 0.4 mg/dL (ref 0.2–1.2)
Total Protein: 6.5 g/dL (ref 6.0–8.3)

## 2017-10-29 LAB — CBC WITH DIFFERENTIAL/PLATELET
Basophils Absolute: 0.1 10*3/uL (ref 0.0–0.1)
Basophils Relative: 0.8 % (ref 0.0–3.0)
Eosinophils Absolute: 0.2 10*3/uL (ref 0.0–0.7)
Eosinophils Relative: 2.7 % (ref 0.0–5.0)
HCT: 42.8 % (ref 39.0–52.0)
Hemoglobin: 14.8 g/dL (ref 13.0–17.0)
Lymphocytes Relative: 24.7 % (ref 12.0–46.0)
Lymphs Abs: 1.7 10*3/uL (ref 0.7–4.0)
MCHC: 34.7 g/dL (ref 30.0–36.0)
MCV: 86.7 fl (ref 78.0–100.0)
Monocytes Absolute: 0.5 10*3/uL (ref 0.1–1.0)
Monocytes Relative: 6.9 % (ref 3.0–12.0)
Neutro Abs: 4.5 10*3/uL (ref 1.4–7.7)
Neutrophils Relative %: 64.9 % (ref 43.0–77.0)
Platelets: 309 10*3/uL (ref 150.0–400.0)
RBC: 4.93 Mil/uL (ref 4.22–5.81)
RDW: 13.6 % (ref 11.5–15.5)
WBC: 7 10*3/uL (ref 4.0–10.5)

## 2017-10-29 LAB — VITAMIN B12: Vitamin B-12: 284 pg/mL (ref 211–911)

## 2017-10-29 LAB — TSH: TSH: 1.77 u[IU]/mL (ref 0.35–4.50)

## 2017-10-29 NOTE — Progress Notes (Signed)
Bruce Knight is a 42 y.o. male here for an acute visit.  History of Present Illness:   HPI: Imbalance, like still moving after turning head. 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 No HA, vision changes, ear pain, URI symptoms, CP, palpitations, SOB, V/D, rash, or edema. Some nausea with vertigo symptoms. No drug use. No supplement use. Has taken prednisone Rx last week with no real relief. Could not afford Dymista. Nonsmoker.   PMHx, SurgHx, SocialHx, Medications, and Allergies were reviewed in the Visit Navigator and updated as appropriate.  Current Medications:   Current Outpatient Medications:  .  diclofenac (VOLTAREN) 75 MG EC tablet, Take 1 tablet (75 mg total) by mouth 2 (two) times daily., Disp: 180 tablet, Rfl: 3 .  omeprazole (PRILOSEC) 40 MG capsule, TAKE 1 CAPSULE (40 MG TOTAL) BY MOUTH DAILY., Disp: 30 capsule, Rfl: 0  Allergies  Allergen Reactions  . Codeine Other (See Comments)    childhood  . Latex   . Sulfa Antibiotics Other (See Comments)    childhood allergy   Review of Systems:   Pertinent items are noted in the HPI. Otherwise, ROS is negative.  Vitals:   Vitals:   10/28/17 1511  BP: 114/72  Pulse: 66  Temp: 98.4 F (36.9 C)  TempSrc: Oral  SpO2: 99%  Weight: 200 lb (90.7 kg)  Height: 5\' 11"  (1.803 m)     Body mass index is 27.89 kg/m.  Physical Exam:   Physical Exam  Constitutional: He is oriented to person, place, and time. He appears well-developed and well-nourished. No distress.  HENT:  Head: Normocephalic and atraumatic.  Right Ear: External ear normal.  Left Ear: External ear normal.  Nose: Nose normal.  Mouth/Throat: Oropharynx is clear and moist.  Eyes: Pupils are equal, round, and reactive to light. Conjunctivae and EOM are normal.  Neck: Normal range of motion. Neck supple.  Cardiovascular: Normal rate, regular rhythm, normal heart sounds and intact distal pulses.  Pulmonary/Chest: Effort normal and  breath sounds normal.  Abdominal: Soft. Bowel sounds are normal.  Musculoskeletal: Normal range of motion.  Neurological: He is alert and oriented to person, place, and time.  Skin: Skin is warm and dry.  Psychiatric: He has a normal mood and affect. His behavior is normal. Judgment and thought content normal.  Nursing note and vitals reviewed.    Results for orders placed or performed in visit on 10/28/17  POCT glucose (manual entry)  Result Value Ref Range   POC Glucose 76 70 - 99 mg/dl    Assessment and Plan:   Bruce Knight was seen today for dizziness.  Diagnoses and all orders for this visit:  Dizziness Comments: Consistent with vertigo but not improving. Labs pending. See treatment below. See AVS. Orders: -     CBC with Differential/Platelet -     Comprehensive metabolic panel -     TSH -     Vitamin B12 -     Meclizine HCl 25 MG CHEW; Chew 1 tablet (25 mg total) by mouth 3 (three) times daily as needed. -     fluticasone (FLONASE) 50 MCG/ACT nasal spray; Place 2 sprays into both nostrils daily. -     POCT glucose (manual entry)  . Reviewed expectations re: course of current medical issues. . Discussed self-management of symptoms. . Outlined signs and symptoms indicating need for more acute intervention. . Patient verbalized understanding and all questions were answered. Bruce Hua Health Maintenance issues including appropriate healthy diet, exercise,  and smoking avoidance were discussed with patient. . See orders for this visit as documented in the electronic medical record. . Patient received an After Visit Summary.  Bruce Rima, DO [place], [facility] 10/29/2017

## 2017-10-30 ENCOUNTER — Other Ambulatory Visit: Payer: Self-pay | Admitting: Family Medicine

## 2017-10-30 DIAGNOSIS — K219 Gastro-esophageal reflux disease without esophagitis: Secondary | ICD-10-CM

## 2017-12-31 ENCOUNTER — Other Ambulatory Visit: Payer: Self-pay | Admitting: Family Medicine

## 2017-12-31 DIAGNOSIS — K219 Gastro-esophageal reflux disease without esophagitis: Secondary | ICD-10-CM

## 2018-03-21 ENCOUNTER — Encounter: Payer: Self-pay | Admitting: Family Medicine

## 2018-03-21 DIAGNOSIS — K219 Gastro-esophageal reflux disease without esophagitis: Secondary | ICD-10-CM

## 2018-03-21 DIAGNOSIS — M05741 Rheumatoid arthritis with rheumatoid factor of right hand without organ or systems involvement: Secondary | ICD-10-CM

## 2018-03-21 DIAGNOSIS — M05742 Rheumatoid arthritis with rheumatoid factor of left hand without organ or systems involvement: Secondary | ICD-10-CM

## 2018-03-24 MED ORDER — DICLOFENAC SODIUM 75 MG PO TBEC: 75.0000 mg | DELAYED_RELEASE_TABLET | Freq: Two times a day (BID) | ORAL | 0 refills | Status: DC

## 2018-03-24 MED ORDER — OMEPRAZOLE 40 MG PO CPDR: DELAYED_RELEASE_CAPSULE | ORAL | 0 refills | Status: DC

## 2018-04-04 ENCOUNTER — Telehealth: Payer: Self-pay | Admitting: Physician Assistant

## 2018-04-04 ENCOUNTER — Ambulatory Visit: Payer: Managed Care, Other (non HMO) | Admitting: Family Medicine

## 2018-04-04 ENCOUNTER — Encounter: Payer: Self-pay | Admitting: Physician Assistant

## 2018-04-04 ENCOUNTER — Ambulatory Visit (INDEPENDENT_AMBULATORY_CARE_PROVIDER_SITE_OTHER): Payer: 59 | Admitting: Physician Assistant

## 2018-04-04 VITALS — BP 124/79 | HR 60 | Temp 98.4°F | Ht 71.0 in | Wt 203.4 lb

## 2018-04-04 DIAGNOSIS — M05741 Rheumatoid arthritis with rheumatoid factor of right hand without organ or systems involvement: Secondary | ICD-10-CM | POA: Diagnosis not present

## 2018-04-04 DIAGNOSIS — M05742 Rheumatoid arthritis with rheumatoid factor of left hand without organ or systems involvement: Secondary | ICD-10-CM | POA: Diagnosis not present

## 2018-04-04 DIAGNOSIS — K219 Gastro-esophageal reflux disease without esophagitis: Secondary | ICD-10-CM

## 2018-04-04 DIAGNOSIS — R42 Dizziness and giddiness: Secondary | ICD-10-CM | POA: Diagnosis not present

## 2018-04-04 MED ORDER — PANTOPRAZOLE SODIUM 40 MG PO TBEC: 40.0000 mg | DELAYED_RELEASE_TABLET | Freq: Every day | ORAL | 0 refills | Status: DC

## 2018-04-04 MED ORDER — FLUTICASONE PROPIONATE 50 MCG/ACT NA SUSP: 2.0000 | Freq: Every day | NASAL | 6 refills | Status: DC

## 2018-04-04 MED ORDER — FAMOTIDINE 20 MG PO TABS: 20.0000 mg | ORAL_TABLET | Freq: Two times a day (BID) | ORAL | 1 refills | Status: DC

## 2018-04-04 MED ORDER — DICLOFENAC SODIUM 75 MG PO TBEC: 75.0000 mg | DELAYED_RELEASE_TABLET | Freq: Two times a day (BID) | ORAL | 2 refills | Status: DC

## 2018-04-04 NOTE — Telephone Encounter (Signed)
Please call patient and let him know that after speaking with our sports medicine provider, I would like to add one other medication to help protect his stomach.  I would like to switch Omeprazole to Pepcid and I have sent this in. Take twice a day for two weeks and then decrease to daily.  Continue Protonix with this.  Likely has some inflammation of his stomach lining in addition to his heartburn due to chronic and recent increase use of the diclofenac.  Definitely needs to follow-up if symptoms worsen or persist.  Jarold Motto PA-C

## 2018-04-04 NOTE — Progress Notes (Signed)
Bruce Knight is a 42 y.o. male here for a new problem.  History of Present Illness:   Chief Complaint  Patient presents with  . Hand Pain    Both  . Gastroesophageal Reflux    HPI   Rheumatoid Arthritis Was on Humira for two years, but recently stopped it a little over a year due to his insurance was no longer able to cover it. He has since been on diclofenac once in AM and PM however over the past couple of months, has started taking diclofenac up to the 3-4 times a day as he has had increased swelling and pain in his bilateral MCP joints. He was seeing Dr. Elpidio Anis [facility]). He has also been on prednisone in the past for his symptoms but declines need for that today.  GERD He has been taking omeprazole 40 mg daily for quite some time and feels as though it is no longer effective. Insurance won't cover it because it is available OTC however he is still taking it regularly. He does endorse specific triggers, including fried foods, sweet tea.  Denies: unintentional weight loss, radiation of pain to back or L-side of chest, rectal bleeding, dysphagia.  Wt Readings from Last 5 Encounters:  04/04/18 203 lb 6.1 oz (92.3 kg)  10/28/17 200 lb (90.7 kg)  10/22/17 198 lb 6.4 oz (90 kg)  01/31/17 200 lb 12.8 oz (91.1 kg)  07/06/16 202 lb 3.2 oz (91.7 kg)    Past Medical History:  Diagnosis Date  . Allergic rhinitis   . Anxiety   . Asthma   . GERD (gastroesophageal reflux disease)   . Rheumatoid arthritis (HCC)      Social History   Socioeconomic History  . Marital status: Married    Spouse name: Not on file  . Number of children: Not on file  . Years of education: Not on file  . Highest education level: Not on file  Occupational History  . Not on file  Social Needs  . Financial resource strain: Not on file  . Food insecurity:    Worry: Not on file    Inability: Not on file  . Transportation needs:    Medical: Not on file    Non-medical: Not on file   Tobacco Use  . Smoking status: Former Games developer  . Smokeless tobacco: Never Used  Substance and Sexual Activity  . Alcohol use: Yes    Alcohol/week: 2.0 standard drinks    Types: 2 Cans of beer per week    Comment: Socially  . Drug use: No  . Sexual activity: Yes    Birth control/protection: Condom  Lifestyle  . Physical activity:    Days per week: Not on file    Minutes per session: Not on file  . Stress: Not on file  Relationships  . Social connections:    Talks on phone: Not on file    Gets together: Not on file    Attends religious service: Not on file    Active member of club or organization: Not on file    Attends meetings of clubs or organizations: Not on file    Relationship status: Not on file  . Intimate partner violence:    Fear of current or ex partner: Not on file    Emotionally abused: Not on file    Physically abused: Not on file    Forced sexual activity: Not on file  Other Topics Concern  . Not on file  Social History Narrative  Married. Spouse is name is Bruce Knight. 2 children.   Commercial driver, associates degree.   Caffeine use, daily vitamin   Wear seatbelt, smoke detector in home, exercises at least 3 times a week   Firearms in the home, locked.   Feels safe in relationship.    Past Surgical History:  Procedure Laterality Date  . WISDOM TOOTH EXTRACTION      Family History  Problem Relation Age of Onset  . Arthritis Mother   . Prostate cancer Father   . Colon cancer Neg Hx     Allergies  Allergen Reactions  . Codeine Other (See Comments)    childhood  . Latex   . Sulfa Antibiotics Other (See Comments)    childhood allergy    Current Medications:   Current Outpatient Medications:  .  diclofenac (VOLTAREN) 75 MG EC tablet, Take 1 tablet (75 mg total) by mouth 2 (two) times daily., Disp: 60 tablet, Rfl: 2 .  fluticasone (FLONASE) 50 MCG/ACT nasal spray, Place 2 sprays into both nostrils daily., Disp: 16 g, Rfl: 6 .  Meclizine HCl 25  MG CHEW, Chew 1 tablet (25 mg total) by mouth 3 (three) times daily as needed., Disp: 15 each, Rfl: 0 .  famotidine (PEPCID) 20 MG tablet, Take 1 tablet (20 mg total) by mouth 2 (two) times daily., Disp: 60 tablet, Rfl: 1 .  pantoprazole (PROTONIX) 40 MG tablet, Take 1 tablet (40 mg total) by mouth daily., Disp: 90 tablet, Rfl: 0   Review of Systems:   ROS Negative unless otherwise specified per HPI.  Vitals:   Vitals:   04/04/18 1515  BP: 124/79  Pulse: 60  Temp: 98.4 F (36.9 C)  TempSrc: Oral  SpO2: 99%  Weight: 203 lb 6.1 oz (92.3 kg)  Height: 5\' 11"  (1.803 m)     Body mass index is 28.37 kg/m.  Physical Exam:   Physical Exam  Constitutional: He appears well-developed. He is cooperative.  Non-toxic appearance. He does not have a sickly appearance. He does not appear ill. No distress.  Cardiovascular: Normal rate, regular rhythm, S1 normal, S2 normal, normal heart sounds and normal pulses.  No LE edema  Pulmonary/Chest: Effort normal and breath sounds normal.  Abdominal: Normal appearance and bowel sounds are normal. There is no tenderness. There is no rigidity, no rebound, no guarding and no CVA tenderness.  Neurological: He is alert. GCS eye subscore is 4. GCS verbal subscore is 5. GCS motor subscore is 6.  Skin: Skin is warm, dry and intact.  Psychiatric: He has a normal mood and affect. His speech is normal and behavior is normal.  Nursing note and vitals reviewed.    Assessment and Plan:   Bruce Knight was seen today for hand pain and gastroesophageal reflux.  Diagnoses and all orders for this visit:  GERD Likely with gastritis on top of his GERD from prolonged NSAID use. Will change omeprazole to pepcid and add protonix. Follow-up in 1 month.  Rheumatoid arthritis involving both hands with positive rheumatoid factor (HCC) Uncontrolled. He is planning to return to [facility] ([facility]) for further management. He declined need for oral steroids. I  have refilled 75 mg diclofenac. -     diclofenac (VOLTAREN) 75 MG EC tablet; Take 1 tablet (75 mg total) by mouth 2 (two) times daily.  Other orders -     pantoprazole (PROTONIX) 40 MG tablet; Take 1 tablet (40 mg total) by mouth daily.   . Reviewed expectations re: course of  current medical issues. . Discussed self-management of symptoms. . Outlined signs and symptoms indicating need for more acute intervention. . Patient verbalized understanding and all questions were answered. . See orders for this visit as documented in the electronic medical record. . Patient received an After-Visit Summary.  CMA or LPN served as scribe during this visit. History, Physical, and Plan performed by medical provider. The above documentation has been reviewed and is accurate and complete.  Jarold Motto, PA-C

## 2018-04-04 NOTE — Patient Instructions (Signed)
It was great to see you!  Stop omeprazole.  Start protonix. If this is not effective for your heartburn, please come back and see [place]. Avoid your triggers!  Please return to Dr. Wallace Cullens for your wrist pain. If you have any trouble making an appointment, please let us know.  Take care,  Jarold Motto PA-C

## 2018-04-07 NOTE — Telephone Encounter (Signed)
Spoke to pt told him Lelon Mast said that after speaking with our sports medicine provider, She would like to add one other medication to help protect your stomach. She would like to switch Omeprazole to Pepcid and she has sent this in to your pharmacy for you. Take twice a day for two weeks and then decrease to daily. Continue Protonix with this. Pt verbalized understanding. Also she said you likely have some inflammation of your stomach lining in addition to your heartburn due to chronic and recent increase use of the diclofenac. Definitely needs to follow-up if symptoms worsen or persist. Pt verbalized understanding.

## 2018-06-30 ENCOUNTER — Other Ambulatory Visit: Payer: Self-pay

## 2018-06-30 MED ORDER — PANTOPRAZOLE SODIUM 40 MG PO TBEC: 40.0000 mg | DELAYED_RELEASE_TABLET | Freq: Every day | ORAL | 0 refills | Status: DC

## 2018-07-31 ENCOUNTER — Other Ambulatory Visit: Payer: Self-pay | Admitting: Physician Assistant

## 2018-07-31 MED ORDER — PANTOPRAZOLE SODIUM 40 MG PO TBEC: 40.0000 mg | DELAYED_RELEASE_TABLET | Freq: Every day | ORAL | 0 refills | Status: DC

## 2018-09-06 DIAGNOSIS — M0609 Rheumatoid arthritis without rheumatoid factor, multiple sites: Secondary | ICD-10-CM

## 2018-09-06 DIAGNOSIS — M2550 Pain in unspecified joint: Secondary | ICD-10-CM | POA: Insufficient documentation

## 2020-01-28 DIAGNOSIS — Z7189 Other specified counseling: Secondary | ICD-10-CM | POA: Diagnosis not present

## 2020-01-28 DIAGNOSIS — Z79899 Other long term (current) drug therapy: Secondary | ICD-10-CM | POA: Diagnosis not present

## 2020-01-28 DIAGNOSIS — M0609 Rheumatoid arthritis without rheumatoid factor, multiple sites: Secondary | ICD-10-CM | POA: Diagnosis not present

## 2020-01-28 DIAGNOSIS — M2550 Pain in unspecified joint: Secondary | ICD-10-CM | POA: Diagnosis not present

## 2020-02-05 DIAGNOSIS — M9905 Segmental and somatic dysfunction of pelvic region: Secondary | ICD-10-CM | POA: Diagnosis not present

## 2020-02-05 DIAGNOSIS — M9904 Segmental and somatic dysfunction of sacral region: Secondary | ICD-10-CM | POA: Diagnosis not present

## 2020-02-05 DIAGNOSIS — M9903 Segmental and somatic dysfunction of lumbar region: Secondary | ICD-10-CM | POA: Diagnosis not present

## 2020-02-05 DIAGNOSIS — M9902 Segmental and somatic dysfunction of thoracic region: Secondary | ICD-10-CM | POA: Diagnosis not present

## 2020-02-09 ENCOUNTER — Telehealth (INDEPENDENT_AMBULATORY_CARE_PROVIDER_SITE_OTHER): Payer: BC Managed Care – PPO | Admitting: Family Medicine

## 2020-02-09 ENCOUNTER — Encounter: Payer: Self-pay | Admitting: Family Medicine

## 2020-02-09 VITALS — Wt 191.0 lb

## 2020-02-09 DIAGNOSIS — R0981 Nasal congestion: Secondary | ICD-10-CM | POA: Diagnosis not present

## 2020-02-09 DIAGNOSIS — R519 Headache, unspecified: Secondary | ICD-10-CM

## 2020-02-09 MED ORDER — AMOXICILLIN-POT CLAVULANATE 875-125 MG PO TABS: 1.0000 | ORAL_TABLET | Freq: Two times a day (BID) | ORAL | 0 refills | Status: DC

## 2020-02-09 NOTE — Progress Notes (Signed)
Virtual Visit via Video Note  I connected with Lovis  on 02/09/20 at 11:20 AM EDT by a video enabled telemedicine application and verified that I am speaking with the correct person using two identifiers.  Location patient: home, [place] Location provider:work or home office Persons participating in the virtual visit: patient, provider  I discussed the limitations of evaluation and management by telemedicine and the availability of in person appointments. The patient expressed understanding and agreed to proceed.   HPI:  Acute telemedicine visit for Sinus issues: -Onset: started yesterday -Symptoms include: sinus congestion, PND, pressure around the eyes and nose, feels more tired, cough -Denies: fevers, NVD, body aches, loss of taste or smell, CP, SOB -Has tried: has tried dayquil and nyquil, allegra, nasacort -Pertinent past medical history: seasonal allergies - takes allegra d and and nasocort; has had some sinus congestion the last few weeks -Pertinent medication allergies: sulfa, codeine -COVID-19 vaccine status: not vaccinated -no known sick contacts  ROS: See pertinent positives and negatives per HPI.  Past Medical History:  Diagnosis Date   Allergic rhinitis    Anxiety    Asthma    GERD (gastroesophageal reflux disease)    Rheumatoid arthritis (HCC)     Past Surgical History:  Procedure Laterality Date   WISDOM TOOTH EXTRACTION       Current Outpatient Medications:    diclofenac (VOLTAREN) 75 MG EC tablet, Take 1 tablet (75 mg total) by mouth 2 (two) times daily., Disp: 60 tablet, Rfl: 2   Etanercept (ENBREL [place]), Inject 1 mg into the skin once a week., Disp: , Rfl:    famotidine (PEPCID) 20 MG tablet, Take 1 tablet (20 mg total) by mouth 2 (two) times daily., Disp: 60 tablet, Rfl: 1   fluticasone (FLONASE) 50 MCG/ACT nasal spray, Place 2 sprays into both nostrils daily., Disp: 16 g, Rfl: 6   Meclizine HCl 25 MG CHEW, Chew 1 tablet (25 mg total) by mouth 3  (three) times daily as needed., Disp: 15 each, Rfl: 0   pantoprazole (PROTONIX) 40 MG tablet, Take 1 tablet (40 mg total) by mouth daily., Disp: 90 tablet, Rfl: 0   amoxicillin-clavulanate (AUGMENTIN) 875-125 MG tablet, Take 1 tablet by mouth 2 (two) times daily., Disp: 20 tablet, Rfl: 0  EXAM:  VITALS per patient if applicable:  GENERAL: alert, oriented, appears well and in no acute distress  HEENT: atraumatic, conjunttiva clear, no obvious abnormalities on inspection of external nose and ears  NECK: normal movements of the head and neck  LUNGS: on inspection no signs of respiratory distress, breathing rate appears normal, no obvious gross SOB, gasping or wheezing  CV: no obvious cyanosis  MS: moves all visible extremities without noticeable abnormality  PSYCH/NEURO: pleasant and cooperative, no obvious depression or anxiety, speech and thought processing grossly intact  ASSESSMENT AND PLAN:  Discussed the following assessment and plan:  Sinus congestion  Facial discomfort  -we discussed possible serious and likely etiologies, options for evaluation and workup, limitations of telemedicine visit vs in person visit, treatment, treatment risks and precautions. Pt prefers to treat via telemedicine empirically rather than in person at this moment.  Discussed possibility of viral upper respiratory infection, COVID-19, possible sinusitis given underlying allergies versus other.  Opted to do trial of nasal saline and short course of Afrin nasal spray for 3 days.  Prescription for Augmentin 875 twice daily x10 days provided in case worsening or not improving, as he feels he likely has a sinus infection.  Advised staying  home all sick and for a full 10 days of Covid testing is positive.  Discussed options for COVID-19 testing. Work/School slipped offered:   declined  Advised to seek prompt follow up telemedicine visit or in person care if worsening, new symptoms arise, or if is not  improving with treatment.  Discussed options for care including his primary care office or urgent care.   I discussed the assessment and treatment plan with the patient. The patient was provided an opportunity to ask questions and all were answered. The patient agreed with the plan and demonstrated an understanding of the instructions.     Terressa Koyanagi, DO

## 2020-02-09 NOTE — Patient Instructions (Signed)
-  stay home while sick, and if you have COVID19 please stay home for a full 10 days since the onset of symptoms PLUS one day of no fever and feeling better  -[place] COVID19 testing information: [email or web address] [place] [phone] If COVID-19 testing is positive, please follow-up with your doctor or via a telemedicine visit.  -Start with nasal saline twice daily and a short 3-day course of Afrin nasal spray  -If worsening or not improving, I did send the Augmentin we discussed to your pharmacy.  This is used to treat sinus infections. Meds ordered this encounter  Medications  . amoxicillin-clavulanate (AUGMENTIN) 875-125 MG tablet    Sig: Take 1 tablet by mouth 2 (two) times daily.    Dispense:  20 tablet    Refill:  0    -can use tylenol or aleve if needed for fevers, aches and pains per instructions   -stay hydrated, drink plenty of fluids and eat small healthy meals - avoid dairy  -can take 1000 IU Vit D3 and Vit C lozenges per instructions  -follow up with your doctor in 2-3 days unless improving and feeling better  I hope you are feeling better soon! Seek in-person care or a follow up telemedicine visit promptly if your symptoms worsen, new concerns arise or you are not improving as expected. Call 911 if severe symptoms.  Only work for [employer] on Tuesdays and Thursdays helping out with telemedicine visits.  If you have concerns or questions on other days, please schedule follow-up with your primary care doctor or schedule a visit at one of the urgent care clinics.

## 2020-02-10 ENCOUNTER — Telehealth: Payer: Self-pay

## 2020-02-10 NOTE — Telephone Encounter (Signed)
Hello, you will need to send this to Dr. Selena Batten for work note.

## 2020-02-10 NOTE — Telephone Encounter (Signed)
Dr. Selena Batten, pt is requesting a work note added to his mychart account .

## 2020-02-10 NOTE — Telephone Encounter (Signed)
Pt is requesting a work note be put in his mychart account. He saw Dr. Selena Batten yesterday

## 2020-02-11 NOTE — Telephone Encounter (Signed)
  Sure! Please copy and paste to letter head if needed or email to him. Thanks!   ---------------------------------------------------------------------------------------------------------------------------    WORK SLIP:  Patient Bruce Knight,  10-27-1975, was seen for a medical visit today, 02/11/20 . Please excuse from work according to the [facility] guidelines for a COVID like illness. We advise 10 days minimum from the onset of symptoms (02/08/20) PLUS 1 day of no fever and improved symptoms. Will defer to employer for a sooner return to work if COVID19 testing is negative and the symptoms have resolved. Advise following CDC guidelines.    Sincerely: E-signature: Dr. Kriste Basque, DO [facility] Ph: [phone]   ------------------------------------------------------------------------------------------------------------------------------

## 2020-02-12 DIAGNOSIS — M9904 Segmental and somatic dysfunction of sacral region: Secondary | ICD-10-CM | POA: Diagnosis not present

## 2020-02-12 DIAGNOSIS — M9905 Segmental and somatic dysfunction of pelvic region: Secondary | ICD-10-CM | POA: Diagnosis not present

## 2020-02-12 DIAGNOSIS — M9902 Segmental and somatic dysfunction of thoracic region: Secondary | ICD-10-CM | POA: Diagnosis not present

## 2020-02-12 DIAGNOSIS — M9903 Segmental and somatic dysfunction of lumbar region: Secondary | ICD-10-CM | POA: Diagnosis not present

## 2020-02-22 DIAGNOSIS — M9904 Segmental and somatic dysfunction of sacral region: Secondary | ICD-10-CM | POA: Diagnosis not present

## 2020-02-22 DIAGNOSIS — M9902 Segmental and somatic dysfunction of thoracic region: Secondary | ICD-10-CM | POA: Diagnosis not present

## 2020-02-22 DIAGNOSIS — M9903 Segmental and somatic dysfunction of lumbar region: Secondary | ICD-10-CM | POA: Diagnosis not present

## 2020-02-22 DIAGNOSIS — M9905 Segmental and somatic dysfunction of pelvic region: Secondary | ICD-10-CM | POA: Diagnosis not present

## 2020-03-23 DIAGNOSIS — R0982 Postnasal drip: Secondary | ICD-10-CM | POA: Diagnosis not present

## 2020-03-23 DIAGNOSIS — R42 Dizziness and giddiness: Secondary | ICD-10-CM | POA: Diagnosis not present

## 2020-03-23 DIAGNOSIS — J018 Other acute sinusitis: Secondary | ICD-10-CM | POA: Diagnosis not present

## 2020-03-26 DIAGNOSIS — Z20822 Contact with and (suspected) exposure to covid-19: Secondary | ICD-10-CM | POA: Diagnosis not present

## 2020-03-28 ENCOUNTER — Other Ambulatory Visit: Payer: Self-pay

## 2020-03-28 ENCOUNTER — Ambulatory Visit: Payer: BC Managed Care – PPO | Admitting: Physician Assistant

## 2020-03-28 ENCOUNTER — Encounter: Payer: Self-pay | Admitting: Physician Assistant

## 2020-03-28 VITALS — BP 150/90 | HR 68 | Temp 97.6°F | Ht 71.0 in | Wt 195.4 lb

## 2020-03-28 DIAGNOSIS — K219 Gastro-esophageal reflux disease without esophagitis: Secondary | ICD-10-CM | POA: Diagnosis not present

## 2020-03-28 DIAGNOSIS — R42 Dizziness and giddiness: Secondary | ICD-10-CM | POA: Diagnosis not present

## 2020-03-28 DIAGNOSIS — H6983 Other specified disorders of Eustachian tube, bilateral: Secondary | ICD-10-CM | POA: Diagnosis not present

## 2020-03-28 LAB — COMPREHENSIVE METABOLIC PANEL
AG Ratio: 1.6 (calc) (ref 1.0–2.5)
ALT: 18 U/L (ref 9–46)
AST: 10 U/L (ref 10–40)
Albumin: 4.2 g/dL (ref 3.6–5.1)
Alkaline phosphatase (APISO): 45 U/L (ref 36–130)
BUN: 16 mg/dL (ref 7–25)
CO2: 31 mmol/L (ref 20–32)
Calcium: 9.5 mg/dL (ref 8.6–10.3)
Chloride: 98 mmol/L (ref 98–110)
Creat: 1.09 mg/dL (ref 0.60–1.35)
Globulin: 2.6 g/dL (calc) (ref 1.9–3.7)
Glucose, Bld: 83 mg/dL (ref 65–99)
Potassium: 3.8 mmol/L (ref 3.5–5.3)
Sodium: 138 mmol/L (ref 135–146)
Total Bilirubin: 0.6 mg/dL (ref 0.2–1.2)
Total Protein: 6.8 g/dL (ref 6.1–8.1)

## 2020-03-28 LAB — CBC WITH DIFFERENTIAL/PLATELET
Absolute Monocytes: 630 cells/uL (ref 200–950)
Basophils Absolute: 34 cells/uL (ref 0–200)
Basophils Relative: 0.4 %
Eosinophils Absolute: 126 cells/uL (ref 15–500)
Eosinophils Relative: 1.5 %
HCT: 46.6 % (ref 38.5–50.0)
Hemoglobin: 16.2 g/dL (ref 13.2–17.1)
Lymphs Abs: 2117 cells/uL (ref 850–3900)
MCH: 30.3 pg (ref 27.0–33.0)
MCHC: 34.8 g/dL (ref 32.0–36.0)
MCV: 87.1 fL (ref 80.0–100.0)
MPV: 9.3 fL (ref 7.5–12.5)
Monocytes Relative: 7.5 %
Neutro Abs: 5494 cells/uL (ref 1500–7800)
Neutrophils Relative %: 65.4 %
Platelets: 303 10*3/uL (ref 140–400)
RBC: 5.35 10*6/uL (ref 4.20–5.80)
RDW: 13 % (ref 11.0–15.0)
Total Lymphocyte: 25.2 %
WBC: 8.4 10*3/uL (ref 3.8–10.8)

## 2020-03-28 MED ORDER — PANTOPRAZOLE SODIUM 40 MG PO TBEC: 40.0000 mg | DELAYED_RELEASE_TABLET | Freq: Every day | ORAL | 3 refills | Status: DC

## 2020-03-28 MED ORDER — IPRATROPIUM BROMIDE 0.03 % NA SOLN: 2.0000 | Freq: Two times a day (BID) | NASAL | 12 refills | Status: DC

## 2020-03-28 NOTE — Patient Instructions (Addendum)
It was great to see you!  Update labs today.  Trial the Epley maneuvers at home (see handout -- you can also look these up on YouTube.) I will put in a referral for you to see a physical therapist to help with this if you do not feel like you are doing these exercises effectively at home.  Consider eye exam -- overall normal in our office. We can hold off on this at this time.   If symptoms do not improve, the next step would be to see a neurologist.  Take care,  Jarold Motto PA-C

## 2020-03-28 NOTE — Progress Notes (Addendum)
Bruce Knight is a 44 y.o. male is here for transfer of care.  I acted as a Neurosurgeon for [name], PA-C Corky Mull, LPN   History of Present Illness:   Chief Complaint  Patient presents with  . Sinus Problem    HPI   Pt is here for transfer of care today.  Sinus problem Pt was seen at [facility] on Wed 11/10 was treated for sinus infection with Amoxicillin and Prednisone. Finished prednisone. Pt still c/o pressure behind eyes and slight dizziness with head movement. Denies ear pain or headaches.  If he has to turn quickly, feels some dizziness. 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  States that he feels like he "almost gets to where equilibrium goes out."    Per chart review he had almost the same type of issues in June 2019. Work-up (labs) at that time was negative. He was given meclizine without much success in improvement of symptoms. Denies vision issues.  GERD Hx of requiring 40 mg prilosec for heartburn. Currently on this medication. Overall doing well but would like to trial protonix again. He was prescribed this in the past and did not have as good of insurance so does not think that he started this. Denies: unusual abdominal pain, rectal bleeding, unintentional weight loss.   There are no preventive care reminders to display for this patient.  Past Medical History:  Diagnosis Date  . Allergic rhinitis   . Anxiety   . Asthma   . GERD (gastroesophageal reflux disease)   . Rheumatoid arthritis (HCC)      Social History   Tobacco Use  . Smoking status: Former Games developer  . Smokeless tobacco: Never Used  Substance Use Topics  . Alcohol use: Yes    Alcohol/week: 6.0 - 12.0 standard drinks    Types: 6 - 12 Cans of beer per week    Comment: Socially  . Drug use: No    Past Surgical History:  Procedure Laterality Date  . WISDOM TOOTH EXTRACTION      Family History  Problem Relation Age of Onset  . Arthritis  Mother   . Prostate cancer Father   . Colon cancer Neg Hx     PMHx, SurgHx, SocialHx, FamHx, Medications, and Allergies were reviewed in the Visit Navigator and updated as appropriate.   Patient Active Problem List   Diagnosis Date Noted  . Polyarthralgia 09/06/2018  . Seasonal allergies 10/22/2017  . Overweight (BMI 25.0-29.9) 09/30/2015  . GERD (gastroesophageal reflux disease) 09/30/2015  . Rheumatoid arthritis (HCC) 07/05/2015  . Adalimumab (Humira) long-term use 07/01/2015    Social History   Tobacco Use  . Smoking status: Former Games developer  . Smokeless tobacco: Never Used  Substance Use Topics  . Alcohol use: Yes    Alcohol/week: 6.0 - 12.0 standard drinks    Types: 6 - 12 Cans of beer per week    Comment: Socially  . Drug use: No    Current Medications and Allergies:    Current Outpatient Medications:  .  amoxicillin-clavulanate (AUGMENTIN) 875-125 MG tablet, Take 1 tablet by mouth 2 (two) times daily., Disp: 20 tablet, Rfl: 0 .  Etanercept (ENBREL [place]), Inject 1 mg into the skin once a week., Disp: , Rfl:  .  fexofenadine-pseudoephedrine (ALLEGRA-D 24) 180-240 MG 24 hr tablet, Take 1 tablet by mouth daily., Disp: , Rfl:  .  fluticasone (FLONASE) 50 MCG/ACT nasal spray, Place 2 sprays into both nostrils  daily., Disp: 16 g, Rfl: 6 .  ipratropium (ATROVENT) 0.03 % nasal spray, Place 2 sprays into both nostrils every 12 (twelve) hours., Disp: 30 mL, Rfl: 12 .  Meclizine HCl 25 MG CHEW, Chew 1 tablet (25 mg total) by mouth 3 (three) times daily as needed. (Patient not taking: Reported on 03/28/2020), Disp: 15 each, Rfl: 0 .  pantoprazole (PROTONIX) 40 MG tablet, Take 1 tablet (40 mg total) by mouth daily., Disp: 30 tablet, Rfl: 3   Allergies  Allergen Reactions  . Codeine Other (See Comments)    childhood  . Latex   . Sulfa Antibiotics Other (See Comments)    childhood allergy    Review of Systems   ROS  Negative unless otherwise specified per HPI.  Vitals:    Vitals:   03/28/20 1031  BP: (!) 150/90  Pulse: 68  Temp: 97.6 F (36.4 C)  TempSrc: Temporal  SpO2: 98%  Weight: 195 lb 6.1 oz (88.6 kg)  Height: 5\' 11"  (1.803 m)     Body mass index is 27.25 kg/m.   Physical Exam:    Physical Exam Vitals and nursing note reviewed.  Constitutional:      General: He is not in acute distress.    Appearance: He is well-developed. He is not ill-appearing or toxic-appearing.  HENT:     Right Ear: Ear canal and external ear normal. A middle ear effusion is present.     Left Ear: Ear canal and external ear normal. A middle ear effusion is present.     Ears:     Comments: Bilateral ears with evidence of mid-ear infusion, clear fluid Cardiovascular:     Rate and Rhythm: Normal rate and regular rhythm.     Pulses: Normal pulses.     Heart sounds: Normal heart sounds, S1 normal and S2 normal.     Comments: No LE edema Pulmonary:     Effort: Pulmonary effort is normal.     Breath sounds: Normal breath sounds.  Skin:    General: Skin is warm and dry.  Neurological:     General: No focal deficit present.     Mental Status: He is alert.     GCS: GCS eye subscore is 4. GCS verbal subscore is 5. GCS motor subscore is 6.     Cranial Nerves: Cranial nerves are intact.     Sensory: Sensation is intact.     Coordination: Coordination is intact.  Psychiatric:        Speech: Speech normal.        Behavior: Behavior normal. Behavior is cooperative.     Visual Acuity Screening   Right eye Left eye Both eyes  Without correction: 20/20 20/25 20/20   With correction:         Assessment and Plan:    Bruce Knight was seen today for sinus problem.  Diagnoses and all orders for this visit:  Dizziness No red flags on discussion. Update CBC and CMP today. No obvious findings on neuro exam. Epley maneuvers printed and given to patient -- will also place referral for vestibular rehab to meet with PT if he is not getting improvement with home  exercises. If PT work-up unrevealing, will refer to neuro. Vision exam mostly normal.  -     CBC with Differential/Platelet; Future -     Comprehensive metabolic panel; Future -     Comprehensive metabolic panel -     CBC with Differential/Platelet -     Ambulatory referral to Physical  Therapy  Dysfunction of both eustachian tubes No evidence of infection on exam. Will trial atrovent nasal spray and continue prn antihistamine. Consider ENT follow-up if symptoms persist.  Gastroesophageal reflux disease without esophagitis Mostly controlled but he would like to see if protonix is more effective. I don't think that this unreasonable. Will send this in for patient. Follow-up if no improvement in symptoms.  Other orders -     ipratropium (ATROVENT) 0.03 % nasal spray; Place 2 sprays into both nostrils every 12 (twelve) hours. -     pantoprazole (PROTONIX) 40 MG tablet; Take 1 tablet (40 mg total) by mouth daily.  CMA or LPN served as scribe during this visit. History, Physical, and Plan performed by medical provider. The above documentation has been reviewed and is accurate and complete.  Jarold Motto, PA-C [place] [place] 03/28/2020  Follow-up: No follow-ups on file.

## 2020-04-03 DIAGNOSIS — K219 Gastro-esophageal reflux disease without esophagitis: Secondary | ICD-10-CM | POA: Diagnosis not present

## 2020-04-03 DIAGNOSIS — R61 Generalized hyperhidrosis: Secondary | ICD-10-CM | POA: Diagnosis not present

## 2020-04-03 DIAGNOSIS — Z20822 Contact with and (suspected) exposure to covid-19: Secondary | ICD-10-CM | POA: Diagnosis not present

## 2020-04-03 DIAGNOSIS — Z79899 Other long term (current) drug therapy: Secondary | ICD-10-CM | POA: Diagnosis not present

## 2020-04-03 DIAGNOSIS — M069 Rheumatoid arthritis, unspecified: Secondary | ICD-10-CM | POA: Diagnosis not present

## 2020-04-03 DIAGNOSIS — R55 Syncope and collapse: Secondary | ICD-10-CM | POA: Diagnosis not present

## 2020-04-03 DIAGNOSIS — R110 Nausea: Secondary | ICD-10-CM | POA: Diagnosis not present

## 2020-04-03 DIAGNOSIS — R0789 Other chest pain: Secondary | ICD-10-CM | POA: Diagnosis not present

## 2020-04-03 DIAGNOSIS — R079 Chest pain, unspecified: Secondary | ICD-10-CM | POA: Diagnosis not present

## 2020-04-03 DIAGNOSIS — R42 Dizziness and giddiness: Secondary | ICD-10-CM | POA: Diagnosis not present

## 2020-04-03 DIAGNOSIS — R000 Tachycardia, unspecified: Secondary | ICD-10-CM | POA: Diagnosis not present

## 2020-04-03 DIAGNOSIS — R0602 Shortness of breath: Secondary | ICD-10-CM | POA: Diagnosis not present

## 2020-04-04 DIAGNOSIS — R0789 Other chest pain: Secondary | ICD-10-CM | POA: Diagnosis not present

## 2020-04-04 DIAGNOSIS — R079 Chest pain, unspecified: Secondary | ICD-10-CM | POA: Diagnosis not present

## 2020-04-05 ENCOUNTER — Ambulatory Visit (HOSPITAL_BASED_OUTPATIENT_CLINIC_OR_DEPARTMENT_OTHER)
Admission: RE | Admit: 2020-04-05 | Discharge: 2020-04-05 | Disposition: A | Discharge status: Home / Self Care | Payer: BC Managed Care – PPO | Source: Ambulatory Visit | Attending: Physician Assistant | Admitting: Physician Assistant

## 2020-04-05 ENCOUNTER — Telehealth: Payer: Self-pay

## 2020-04-05 ENCOUNTER — Other Ambulatory Visit: Payer: Self-pay | Admitting: Physician Assistant

## 2020-04-05 ENCOUNTER — Other Ambulatory Visit: Payer: Self-pay

## 2020-04-05 DIAGNOSIS — R42 Dizziness and giddiness: Secondary | ICD-10-CM

## 2020-04-05 DIAGNOSIS — R710 Precipitous drop in hematocrit: Secondary | ICD-10-CM

## 2020-04-05 DIAGNOSIS — H6593 Unspecified nonsuppurative otitis media, bilateral: Secondary | ICD-10-CM

## 2020-04-05 NOTE — Telephone Encounter (Signed)
Patient is calling in asking for a referral to ENT, for the fluid behind the ear. States the nose spray has not worked and he had an episode on Sunday where he started to become dizzy while driving then started to have chest pains, went to ED but couldn't find anything wrong besides the fluid behind the ear.

## 2020-04-05 NOTE — Telephone Encounter (Signed)
Orders placed.

## 2020-04-05 NOTE — Telephone Encounter (Signed)
Please see message. Okay for referral to ENT?

## 2020-04-05 NOTE — Telephone Encounter (Signed)
Spoke to pt told her per Lelon Mast, she has reviewed your hospital encounter.   It was recommended that he follow-up with [place] to re-evaluate hisblood pressure and do additional testing as they stated that his symptoms could be from: his blood pressure being slightly elevated, fluid behind his ears, vertigo, vascular causes. Pt verbalized understanding.   While I do think that we can refer to ENT, I think that it is more important that we do some evaluation to make sure your heart and brain are ok. He also had a decreased hemoglobin at the ER, we need to recheck this.   If he is agreeable, I would like to order: head CT, carotid u/s, Holter monitor, repeat blood work, ENT referral. Pt verbalized understanding and is agreeable to testing and repeat labs. Told him someone will be in touch with him to schedule tests and can schedule lab appt now. Pt verbalized understanding. Lap appt scheduled for tomorrow at 8:30 AM.

## 2020-04-05 NOTE — Telephone Encounter (Signed)
I have reviewed his hospital encounter.  It was recommended that he follow-up with [place] to re-evaluate hisblood pressure and do additional testing as they stated that his symptoms could be from: his blood pressure being slightly elevated, fluid behind his ears, vertigo, vascular causes.  While I do think that we can refer to ENT, I think that it is more important that we do some evaluation to make sure your heart and brain are ok. He also had a decreased hemoglobin at the ER, we need to recheck this.  If he is agreeable, I would like to order: head CT, carotid u/s, Holter monitor, repeat blood work, ENT referral.  Please let me know what he would like to do.

## 2020-04-05 NOTE — Telephone Encounter (Signed)
Bruce Knight, pt is agreeable for you to order all testing and referral to ENT. I have placed referral to ENT and Lab appointment scheduled for tomorrow just need to place orders. Pt would like to have done as soon as possible needs to go back to work next week.

## 2020-04-05 NOTE — Addendum Note (Signed)
Addended by: Jimmye Norman on: 04/05/2020 11:32 AM   Modules accepted: Orders

## 2020-04-06 ENCOUNTER — Ambulatory Visit (HOSPITAL_BASED_OUTPATIENT_CLINIC_OR_DEPARTMENT_OTHER)
Admission: RE | Admit: 2020-04-06 | Discharge: 2020-04-06 | Disposition: A | Discharge status: Home / Self Care | Payer: BC Managed Care – PPO | Source: Ambulatory Visit | Attending: Physician Assistant | Admitting: Physician Assistant

## 2020-04-06 ENCOUNTER — Other Ambulatory Visit: Payer: BC Managed Care – PPO

## 2020-04-06 DIAGNOSIS — R42 Dizziness and giddiness: Secondary | ICD-10-CM

## 2020-04-06 DIAGNOSIS — R710 Precipitous drop in hematocrit: Secondary | ICD-10-CM

## 2020-04-06 DIAGNOSIS — I6523 Occlusion and stenosis of bilateral carotid arteries: Secondary | ICD-10-CM | POA: Diagnosis not present

## 2020-04-06 DIAGNOSIS — R079 Chest pain, unspecified: Secondary | ICD-10-CM | POA: Diagnosis not present

## 2020-04-07 LAB — CBC WITH DIFFERENTIAL/PLATELET
Absolute Monocytes: 362 cells/uL (ref 200–950)
Basophils Absolute: 22 cells/uL (ref 0–200)
Basophils Relative: 0.4 %
Eosinophils Absolute: 346 cells/uL (ref 15–500)
Eosinophils Relative: 6.4 %
HCT: 46.2 % (ref 38.5–50.0)
Hemoglobin: 16 g/dL (ref 13.2–17.1)
Lymphs Abs: 1485 cells/uL (ref 850–3900)
MCH: 30.2 pg (ref 27.0–33.0)
MCHC: 34.6 g/dL (ref 32.0–36.0)
MCV: 87.3 fL (ref 80.0–100.0)
MPV: 9.8 fL (ref 7.5–12.5)
Monocytes Relative: 6.7 %
Neutro Abs: 3186 cells/uL (ref 1500–7800)
Neutrophils Relative %: 59 %
Platelets: 275 10*3/uL (ref 140–400)
RBC: 5.29 10*6/uL (ref 4.20–5.80)
RDW: 13 % (ref 11.0–15.0)
Total Lymphocyte: 27.5 %
WBC: 5.4 10*3/uL (ref 3.8–10.8)

## 2020-04-07 LAB — B12 AND FOLATE PANEL
Folate: 22.7 ng/mL
Vitamin B-12: 447 pg/mL (ref 200–1100)

## 2020-04-08 ENCOUNTER — Other Ambulatory Visit (INDEPENDENT_AMBULATORY_CARE_PROVIDER_SITE_OTHER): Payer: BC Managed Care – PPO

## 2020-04-08 DIAGNOSIS — R42 Dizziness and giddiness: Secondary | ICD-10-CM

## 2020-04-12 ENCOUNTER — Other Ambulatory Visit: Payer: Self-pay

## 2020-04-12 ENCOUNTER — Ambulatory Visit: Payer: BC Managed Care – PPO | Admitting: Physician Assistant

## 2020-04-12 ENCOUNTER — Encounter: Payer: Self-pay | Admitting: Physician Assistant

## 2020-04-12 VITALS — BP 122/86 | HR 72 | Temp 98.0°F | Wt 200.0 lb

## 2020-04-12 DIAGNOSIS — R42 Dizziness and giddiness: Secondary | ICD-10-CM | POA: Diagnosis not present

## 2020-04-12 MED ORDER — PROPRANOLOL HCL 20 MG PO TABS: 20.0000 mg | ORAL_TABLET | Freq: Three times a day (TID) | ORAL | 1 refills | Status: DC | PRN

## 2020-04-12 NOTE — Patient Instructions (Signed)
It was great to see you!  Send the heart monitor patch back -- I will let them know you are sending back early. I will be in touch with the results.  Buy BP cuff. If you are having an episode, take your blood pressure and see what you get.  Trial propranolol as needed. May take up to 3 times daily. We use this for anxiety and palpitations. Should not cause any sedation.  Attend the physical therapy appointment as well to see if there is anything else going on.  Take care,  Jarold Motto PA-C

## 2020-04-12 NOTE — Progress Notes (Signed)
Bruce Knight is a 44 y.o. male here for a follow up of a pre-existing problem.  History of Present Illness:   Chief Complaint  Patient presents with  . Dizziness    was seen at ER last week, has been using OTC ear drops and hydrogen peroxide to "clean ears out"    HPI  Dizziness This is his 4th visit for this.  He was last seen at [facility] ER on 04/03/20. Had normal D-dimer, stress test with echo, chest xray. His blood work showed a possible drop in hemoglobin but this was rechecked by [place] in the interim and was normal.  Since his ER visit, we had further work-up. He had normal carotid u/s and head CT. He is currently wearing a ZIO-patch. Since wearing the ZIO-patch, he has had a 45 minute episode on Sunday while driving. Developed dizziness, heavy breathing that required him to pull over for about 45 minutes. He has had episodes start in the past that he has been able to distract himself out of in order to improve his symptoms.  Has vestibular rehab appt on 12/14.  Has not heard from ENT office.  No results found for: HGBA1C    Past Medical History:  Diagnosis Date  . Allergic rhinitis   . Anxiety   . Asthma   . GERD (gastroesophageal reflux disease)   . Rheumatoid arthritis (HCC)      Social History   Tobacco Use  . Smoking status: Former Games developer  . Smokeless tobacco: Never Used  Substance Use Topics  . Alcohol use: Yes    Alcohol/week: 6.0 - 12.0 standard drinks    Types: 6 - 12 Cans of beer per week    Comment: Socially  . Drug use: No    Past Surgical History:  Procedure Laterality Date  . WISDOM TOOTH EXTRACTION      Family History  Problem Relation Age of Onset  . Arthritis Mother   . Prostate cancer Father   . Colon cancer Neg Hx     Allergies  Allergen Reactions  . Codeine Other (See Comments)    childhood  . Latex   . Sulfa Antibiotics Other (See Comments)    childhood allergy    Current Medications:   Current Outpatient Medications:  .   ENBREL [place] 50 MG/ML injection, Inject into the skin., Disp: , Rfl:  .  fexofenadine-pseudoephedrine (ALLEGRA-D 24) 180-240 MG 24 hr tablet, Take 1 tablet by mouth daily., Disp: , Rfl:  .  fluticasone (FLONASE) 50 MCG/ACT nasal spray, Place 2 sprays into both nostrils daily., Disp: 16 g, Rfl: 6 .  ipratropium (ATROVENT) 0.03 % nasal spray, Place 2 sprays into both nostrils every 12 (twelve) hours., Disp: 30 mL, Rfl: 12 .  pantoprazole (PROTONIX) 40 MG tablet, Take 1 tablet (40 mg total) by mouth daily., Disp: 30 tablet, Rfl: 3 .  Meclizine HCl 25 MG CHEW, Chew 1 tablet (25 mg total) by mouth 3 (three) times daily as needed. (Patient not taking: Reported on 03/28/2020), Disp: 15 each, Rfl: 0 .  propranolol (INDERAL) 20 MG tablet, Take 1 tablet (20 mg total) by mouth 3 (three) times daily as needed (anxiety, palpitations)., Disp: 30 tablet, Rfl: 1   Review of Systems:   ROS  Negative unless otherwise specified per HPI.  Vitals:   Vitals:   04/12/20 1059  BP: 122/86  Pulse: 72  Temp: 98 F (36.7 C)  TempSrc: Temporal  SpO2: 98%  Weight: 200 lb (90.7 kg)  Body mass index is 27.89 kg/m.  Physical Exam:   Physical Exam Vitals and nursing note reviewed.  Constitutional:      General: He is not in acute distress.    Appearance: He is well-developed. He is not ill-appearing or toxic-appearing.  HENT:     Head: Normocephalic and atraumatic.     Right Ear: Tympanic membrane, ear canal and external ear normal. Tympanic membrane is not erythematous, retracted or bulging.     Left Ear: Tympanic membrane, ear canal and external ear normal. Tympanic membrane is not erythematous, retracted or bulging.     Nose: Nose normal.     Right Sinus: No maxillary sinus tenderness or frontal sinus tenderness.     Left Sinus: No maxillary sinus tenderness or frontal sinus tenderness.     Mouth/Throat:     Pharynx: Uvula midline. No posterior oropharyngeal erythema.  Eyes:     General: Lids  are normal.     Conjunctiva/sclera: Conjunctivae normal.  Neck:     Trachea: Trachea normal.  Cardiovascular:     Rate and Rhythm: Normal rate and regular rhythm.     Pulses: Normal pulses.     Heart sounds: Normal heart sounds, S1 normal and S2 normal.     Comments: No LE edema Pulmonary:     Effort: Pulmonary effort is normal.     Breath sounds: Normal breath sounds. No decreased breath sounds, wheezing, rhonchi or rales.  Lymphadenopathy:     Cervical: No cervical adenopathy.  Skin:    General: Skin is warm and dry.  Neurological:     Mental Status: He is alert.     GCS: GCS eye subscore is 4. GCS verbal subscore is 5. GCS motor subscore is 6.  Psychiatric:        Speech: Speech normal.        Behavior: Behavior normal. Behavior is cooperative.      Assessment and Plan:   Rocko was seen today for dizziness.  Diagnoses and all orders for this visit:  Dizziness No red flags on exam. Blood pressure is WNL. Recommend purchasing BP monitor and taking to work, if symptomatic, check blood pressure to see if this is abnormal. Will go ahead and have patient send back ZIO patch now so we can see if we can get information from the event he had on Sunday while wearing the patch. Trial propranolol 20 mg TID prn for symptoms to see if this helps. Follow-up with previously scheduled vestibular rehab for further evaluation/mgmt. Follow-up with [place] based on these results.  Other orders -     propranolol (INDERAL) 20 MG tablet; Take 1 tablet (20 mg total) by mouth 3 (three) times daily as needed (anxiety, palpitations).  Jarold Motto, PA-C

## 2020-04-26 ENCOUNTER — Encounter: Payer: BC Managed Care – PPO | Admitting: Physical Therapy

## 2020-04-27 ENCOUNTER — Other Ambulatory Visit: Payer: Self-pay | Admitting: Physician Assistant

## 2020-04-27 DIAGNOSIS — I441 Atrioventricular block, second degree: Secondary | ICD-10-CM

## 2020-04-27 DIAGNOSIS — R42 Dizziness and giddiness: Secondary | ICD-10-CM

## 2020-04-28 ENCOUNTER — Encounter: Payer: Self-pay | Admitting: Physician Assistant

## 2020-04-29 NOTE — Progress Notes (Signed)
Spoke with pt and he verbalized understanding and will call cardiology back to schedule an appointment

## 2020-05-03 ENCOUNTER — Encounter: Payer: BC Managed Care – PPO | Admitting: Physical Therapy

## 2020-05-04 DIAGNOSIS — Z20822 Contact with and (suspected) exposure to covid-19: Secondary | ICD-10-CM | POA: Diagnosis not present

## 2020-05-19 ENCOUNTER — Encounter: Payer: Self-pay | Admitting: Cardiology

## 2020-05-19 ENCOUNTER — Other Ambulatory Visit: Payer: Self-pay

## 2020-05-19 ENCOUNTER — Ambulatory Visit: Payer: BC Managed Care – PPO | Admitting: Cardiology

## 2020-05-19 VITALS — BP 119/75 | HR 71 | Ht 71.0 in | Wt 195.6 lb

## 2020-05-19 DIAGNOSIS — Z7189 Other specified counseling: Secondary | ICD-10-CM | POA: Diagnosis not present

## 2020-05-19 DIAGNOSIS — Z8616 Personal history of COVID-19: Secondary | ICD-10-CM

## 2020-05-19 DIAGNOSIS — R42 Dizziness and giddiness: Secondary | ICD-10-CM

## 2020-05-19 DIAGNOSIS — R9431 Abnormal electrocardiogram [ECG] [EKG]: Secondary | ICD-10-CM | POA: Diagnosis not present

## 2020-05-19 NOTE — Progress Notes (Signed)
Cardiology Office Note:    Date:  05/19/2020   ID:  Bruce Knight, DOB Jul 13, 1975, MRN [id number]  PCP:  Bruce Knight, [place]  Cardiologist:  Bruce Dresser, MD  Referring MD: Bruce Knight, [place]   CC: new patient consultation for abnormal heart monitor  History of Present Illness:    Bruce Knight is a 45 y.o. male with a hx of dizzyness who is seen as a new consult at the request of Bruce Knight, [place] for the evaluation and management of second degree AV block, Mobitz 1.  Of note, he tested Covid positive through [facility] on 05/04/20. Has had issues with dizziness. Has had normal d-dimer, stress echo, CXR, normal carotid ultrasounds, head CT. Wore a monitor, reviewed below  Today: Started feeling better around 1.2, Tested positive for Covid 12/22, went back to work on 05/15/20 and has felt the best he has in months.  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 Noted mild sensation of spinning. 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 Noted more on longer tripes. When he could get out and move around, symptoms weren't bad. Had one episode when he was driving in [place]. Had severe chest tightness, shortness of breath, feeling dizzy. Called 911, went to [facility]. Workup unremarkable. He wonders now if this was a panic attack.   Had symptoms when on monitor, 04/10/20. Has 4 events clustered around 5:30-6 PM, all NSR. We reviewed strips together today.  Frequency: every time he was driving Duration of episodes: 45 minutes on average. 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 Better if he sat and stopped, walked around Syncope: none Pre-existing medical conditions: none Potential medication/supplement interactions: no recent changes. Prior workup: stress echo, zio as below ECG: NSR Family history: maternal grandmother had MI, maternal grandfather may have had heart issues  Denies chest  pain, shortness of breath at rest or with normal exertion. No PND, orthopnea, LE edema or unexpected weight gain. No syncope or palpitations.  Past Medical History:  Diagnosis Date  . Allergic rhinitis   . Anxiety   . Asthma   . GERD (gastroesophageal reflux disease)   . Rheumatoid arthritis [facility])     Past Surgical History:  Procedure Laterality Date  . WISDOM TOOTH EXTRACTION      Current Medications: Current Outpatient Medications on File Prior to Visit  Medication Sig  . ENBREL SURECLICK 50 MG/ML injection Inject into the skin.  . fexofenadine-pseudoephedrine (ALLEGRA-D 24) 180-240 MG 24 hr tablet Take 1 tablet by mouth daily.  . fluticasone (FLONASE) 50 MCG/ACT nasal spray Place 2 sprays into both nostrils daily.  Marland Kitchen ipratropium (ATROVENT) 0.03 % nasal spray Place 2 sprays into both nostrils every 12 (twelve) hours.  . Meclizine HCl 25 MG CHEW Chew 1 tablet (25 mg total) by mouth 3 (three) times daily as needed.  . pantoprazole (PROTONIX) 40 MG tablet Take 1 tablet (40 mg total) by mouth daily.  . propranolol (INDERAL) 20 MG tablet Take 1 tablet (20 mg total) by mouth 3 (three) times daily as needed (anxiety, palpitations).   No current facility-administered medications on file prior to visit.     Allergies:   Codeine, Latex, and Sulfa antibiotics   Social History   Tobacco Use  . Smoking status: Former Research scientist (life sciences)  . Smokeless tobacco: Never Used  Substance Use Topics  . Alcohol use: Yes    Alcohol/week: 6.0 - 12.0 standard drinks    Types:  6 - 12 Cans of beer per week    Comment: Socially  . Drug use: No    Family History: family history includes Arthritis in his mother; Prostate cancer in his father. There is no history of Colon cancer.  ROS:   Please see the history of present illness.  Additional pertinent ROS: Constitutional: Negative for chills, fever before Covid, had symptoms with Covid, now resolved HENT: Negative for ear pain and hearing loss.   Eyes:  Negative for loss of vision and eye pain.  Respiratory: Negative for cough, sputum, wheezing.   Cardiovascular: See HPI. Gastrointestinal: Negative for abdominal pain, melena, and hematochezia.  Genitourinary: Negative for dysuria and hematuria.  Musculoskeletal: Negative for falls and myalgias.  Skin: Negative for itching and rash.  Neurological: Negative for focal weakness, focal sensory changes and loss of consciousness.  Endo/Heme/Allergies: Does not bruise/bleed easily.     EKGs/Labs/Other Studies Reviewed:    The following studies were reviewed today: Echo stress test 04/04/20 ([facility]) HISTORY: Chest pain and Dizziness   REASON: Assess LV function  INDICATION: R07.9 - Chest pain, unspecified.   RESTING ECHOCARDIOGRAPHIC MEASUREMENTS----------------------------------------   2D DIMENSIONS  AORTA     Values Normal Range   Asc.Aorta  2.7 cm [2.6 - 3.4]  LEFT VENTRICLE     LVIDd  4.3 cm [4.2 - 5.9]     LVIDs  2.8 cm       FS 35%   [> 25%]      SWT  1.0 cm [0.6 - 1.0]      PWT  1.0 cm [0.6 - 1.0]  LEFT ATRIUM    LA Diam  3.5 cm [3.0 - 4.0]    LA Area 18  cm2 [< 20]   LA Volume 56  mL [18 - 58]  RIGHT VENTRICLE    RV Base  4.0 cm [<= 4.2]      RV Mid  3.8 cm [<= 3.5]   RESTING ECHOCARDIOGRAPHIC DESCRIPTIONS ---------------------------------------   LEFT VENTRICLE      Size: Normal  Contraction: Normal           LV Masses: No Masses      LVH: None   Dias.Class: Normal   RIGHT VENTRICLE      Size: Normal           Free Wall: Normal  Contraction: Normal           RV Masses: No Masses   PERICARDIUM     Fluid: No effusion   RESTING DOPPLER ECHO and OTHER SPECIAL PROCEDURES ----------------------------    Aortic: No AR         No AS    Mitral: TRIVIAL MR       No MS  MV Inflow Vel: 61.0 cm/s MV  Annulus E'Vel: 9.0 cm/s E/E' Ratio: 7   Tricuspid: TRIVIAL TR       No TS   Pulmonary: No PR         No PS   STRESS ECHOCARDIOGRAPHY ------------------------------------------------------       Protocol: Treadmill (Bruce)       Drugs: None  Target Heart Rate: Predicted Heart Rate: 178    Resting ECG: Normal   TYPE   STAGE  TIME  HR  BP  RPE SPO2 COMMENTS  -------- ----- --------- --- ------- ----- ---- ------------------------------  Baseline         60 147/ 91  Stress   1 180 sec. 102 152/107 9/20   NO  SYMPTOMS  Stress   2  180 sec. 123 164/103 13/20   NO SYMTPOMS  Stress   3  180 sec. 171 198/ 96 16/20   DIZZINESS 5/10  Stress   4   28 sec. 176  /  17/20   DIZZINESS 5/10  Recovery  1  1 min. 133 188/100  Recovery  2   2 min. 123 182/ 99      DIZZINESS 5/10  Recovery  3   4 min. 115 157/ 99  Recovery  4   6 min. 109 148/101  Recovery  5   8 min. 102 135/101      DIZZINESS 2/10  Recovery  6   10 min. 105129/ 99  Recovery  7   17 min. 89 151/ 95      DIZZINESS RESOLVED   Stress Duration: 9.47 minutes. Max Stress H.R: 176  Target Heart Rate (151) Achieved: Yes  Max. workload of 10.50 METs was achieved during exercise.    HR Response: Appropriate    BP Response: Resting hypertension - exaggerated response   WALL SEGMENT FINDINGS --------------------------------------------------------              Rest      Stress             --------------- ---------------  Anterior Septum Basal: Normal     Hyperkinetic          Mid: Normal     Hyperkinetic     Apical Septum: Normal     Hyperkinetic   Anterior Wall Basal: Normal     Hyperkinetic          Mid: Normal     Hyperkinetic         Apical: Normal     Hyperkinetic    Lateral  Wall Basal: Normal     Hyperkinetic          Mid: Normal     Hyperkinetic         Apical: Normal     Hyperkinetic   Posterior Wall Basal: Normal     Hyperkinetic          Mid: Normal     Hyperkinetic   Inferior Wall Basal: Normal     Hyperkinetic          Mid: Normal     Hyperkinetic         Apical: Normal     Hyperkinetic   Inferior Septum Basal: Normal     Hyperkinetic          Mid: Normal     Hyperkinetic            EF: >55%      >55%    ADDITIONAL FINDINGS ----------------------------------------------------------   Chest Discomfort: None     Arrhythmia: None  Termination Reason: Fatigue    Complications: None   STRESS ECG RESULTS -----------------------------------------------------------   ECG Results: EQUIVOCAL. Marland Kitchen   INTERPRETATION ---------------------------------------------------------------   NORMAL STRESS TEST. NORMAL RESTING STUDY WITH NO WALL MOTION ABNORMALITIES  AT REST AND PEAK STRESS.  NORMAL LA PRESSURES WITH NORMAL DIASTOLIC FUNCTION  VALVULAR REGURGITATION: TRIVIAL MR, TRIVIAL TR  NO VALVULARSTENOSIS  Maximum workload of 10.50 METs was achieved during exercise.  RESTING HYPERTENSION - EXAGGERATED RESPONSE    Zio monitor 04/27/20  Patient had a min HR of 29 bpm, max HR of 160 bpm, and avg HR of 82 bpm.  Second Degree Second Degree AV Block-Mobitz I (Wenckebach) was present.  No advanced  degree of AV block. No arrhythmias.   Predominant underlying rhythm was Sinus Rhythm.  Second Degree AV Block-Mobitz I (Wenckebach) was present.   EKG:  EKG is personally reviewed.  The ekg ordered today demonstrates NSR at 71 bpm  Recent Labs: 03/28/2020: ALT 18; BUN 16; Creat 1.09; Potassium 3.8; Sodium 138 04/06/2020: Hemoglobin 16.0; Platelets 275  Recent Lipid Panel No results found for: CHOL, TRIG, HDL, CHOLHDL,  VLDL, LDLCALC, LDLDIRECT  Physical Exam:    VS:  BP 119/75   Pulse 71   Ht 5\' 11"  (1.803 m)   Wt 195 lb 9.6 oz (88.7 kg)   SpO2 100%   BMI 27.28 kg/m     Wt Readings from Last 3 Encounters:  05/19/20 195 lb 9.6 oz (88.7 kg)  04/12/20 200 lb (90.7 kg)  03/28/20 195 lb 6.1 oz (88.6 kg)    GEN: Well nourished, well developed in no acute distress HEENT: Normal, moist mucous membranes NECK: No JVD CARDIAC: regular rhythm, normal S1 and S2, no rubs or gallops. No murmur. VASCULAR: Radial and DP pulses 2+ bilaterally. No carotid bruits RESPIRATORY:  Clear to auscultation without rales, wheezing or rhonchi  ABDOMEN: Soft, non-tender, non-distended MUSCULOSKELETAL:  Ambulates independently SKIN: Warm and dry, no edema NEUROLOGIC:  Alert and oriented x 3. No focal neuro deficits noted. PSYCHIATRIC:  Normal affect    ASSESSMENT:    1. Dizziness   2. Abnormal ECG   3. Personal history of COVID-19   4. Cardiac risk counseling   5. Counseling on health promotion and disease prevention    PLAN:    Dizziness: -now resolved post Covid -sounds more suggestive of vertigo. We discussed BPPV today. He had PT scheduled for this, but as he is feeling better he plans to cancel  Abnormal heart monitor/mobile ECG -his symptomatic events were all normal sinus rhythm -he had incidental dropped beat. No high risk block -had stress echo at [facility], normal -no high risk findings that need further evaluation  Recent Covid infection: -mild symptoms, fully recovered  Cardiac risk counseling and prevention recommendations: -recommend heart healthy/Mediterranean diet, with whole grains, fruits, vegetable, fish, lean meats, nuts, and olive oil. Limit salt. -recommend moderate walking, 3-5 times/week for 30-50 minutes each session. Aim for at least 150 minutes.week. Goal should be pace of 3 miles/hours, or walking 1.5 miles in 30 minutes -recommend avoidance of tobacco products. Avoid excess  alcohol. -ASCVD risk score: The ASCVD Risk score 03/30/20 DC Jr., et al., 2013) failed to calculate for the following reasons:   Cannot find a previous HDL lab   Cannot find a previous total cholesterol lab    Plan for follow up: as needed  2014, MD, PhD [place]  [facility]    Medication Adjustments/Labs and Tests Ordered: Current medicines are reviewed at length with the patient today.  Concerns regarding medicines are outlined above.  No orders of the defined types were placed in this encounter.  No orders of the defined types were placed in this encounter.   Patient Instructions  Medication Instructions:  Your Physician recommend you continue on your current medication as directed.    *If you need a refill on your cardiac medications before your next appointment, please call your pharmacy*   Lab Work: None   Testing/Procedures: None   Follow-Up: At [facility], you and your health needs are our priority.  As part of our continuing mission to provide you with exceptional heart care, we have created designated Provider Care  Teams.  These Care Teams include your primary Cardiologist (physician) and Advanced Practice Providers (APPs -  Physician Assistants and Nurse Practitioners) who all work together to provide you with the care you need, when you need it.  We recommend signing up for the patient portal called "MyChart".  Sign up information is provided on this After Visit Summary.  MyChart is used to connect with patients for Virtual Visits (Telemedicine).  Patients are able to view lab/test results, encounter notes, upcoming appointments, etc.  Non-urgent messages can be sent to your provider as well.   To learn more about what you can do with MyChart, go to [email or web address].    Your next appointment:   As needed  The format for your next appointment:   In Person  Provider:   Jodelle Red, MD     Signed, Jodelle Red, MD PhD 05/19/2020 1:26 PM    [facility]

## 2020-05-19 NOTE — Patient Instructions (Addendum)

## 2020-06-19 ENCOUNTER — Other Ambulatory Visit: Payer: Self-pay | Admitting: Physician Assistant

## 2020-09-16 DIAGNOSIS — Z7189 Other specified counseling: Secondary | ICD-10-CM | POA: Diagnosis not present

## 2020-09-16 DIAGNOSIS — Z79899 Other long term (current) drug therapy: Secondary | ICD-10-CM | POA: Diagnosis not present

## 2020-09-16 DIAGNOSIS — M0609 Rheumatoid arthritis without rheumatoid factor, multiple sites: Secondary | ICD-10-CM | POA: Diagnosis not present

## 2020-09-16 DIAGNOSIS — Z111 Encounter for screening for respiratory tuberculosis: Secondary | ICD-10-CM | POA: Diagnosis not present

## 2020-10-26 DIAGNOSIS — L723 Sebaceous cyst: Secondary | ICD-10-CM | POA: Diagnosis not present

## 2020-10-26 DIAGNOSIS — L720 Epidermal cyst: Secondary | ICD-10-CM | POA: Diagnosis not present

## 2020-12-20 ENCOUNTER — Other Ambulatory Visit: Payer: Self-pay | Admitting: Physician Assistant

## 2021-02-23 ENCOUNTER — Encounter: Payer: Self-pay | Admitting: Physician Assistant

## 2021-02-23 ENCOUNTER — Telehealth (INDEPENDENT_AMBULATORY_CARE_PROVIDER_SITE_OTHER): Payer: BC Managed Care – PPO | Admitting: Physician Assistant

## 2021-02-23 VITALS — Ht 71.0 in | Wt 185.0 lb

## 2021-02-23 DIAGNOSIS — R051 Acute cough: Secondary | ICD-10-CM

## 2021-02-23 MED ORDER — AMOXICILLIN-POT CLAVULANATE 875-125 MG PO TABS: 1.0000 | ORAL_TABLET | Freq: Two times a day (BID) | ORAL | 0 refills | Status: DC

## 2021-02-23 NOTE — Progress Notes (Signed)
Virtual Visit via Video   I connected with Bruce Knight on 02/23/21 at 10:00 AM EDT by a video enabled telemedicine application and verified that I am speaking with the correct person using two identifiers. Location patient: Home Location provider: [facility], Office Persons participating in the virtual visit: Bruce Knight, Jarold Motto PA-C, Corky Mull, LPN   I discussed the limitations of evaluation and management by telemedicine and the availability of in person appointments. The patient expressed understanding and agreed to proceed.  I acted as a Neurosurgeon for [name], [employer], LPN    Subjective:   HPI:   Cough Pt c/o sore throat and cough x 2 days, fatigue. He is coughing and expectorating dark yellow. Had headache yesterday took Ibuprofen with relief. He has been taking throat spray, Mucinex, cough drops, Atrovent nasal spray. Did home COVID test 2 days ago negative. Denies fever or chills. Had a little bit of weakness.   Wife and son were sick last week for similar symptoms. Took them around 3-4 days to clear it.   ROS: See pertinent positives and negatives per HPI.  Patient Active Problem List   Diagnosis Date Noted   Polyarthralgia 09/06/2018   Seasonal allergies 10/22/2017   Overweight (BMI 25.0-29.9) 09/30/2015   GERD (gastroesophageal reflux disease) 09/30/2015   Rheumatoid arthritis (HCC) 07/05/2015   Adalimumab (Humira) long-term use 07/01/2015    Social History   Tobacco Use   Smoking status: Former   Smokeless tobacco: Never  Substance Use Topics   Alcohol use: Yes    Alcohol/week: 6.0 - 12.0 standard drinks    Types: 6 - 12 Cans of beer per week    Comment: Socially    Current Outpatient Medications:    amoxicillin-clavulanate (AUGMENTIN) 875-125 MG tablet, Take 1 tablet by mouth 2 (two) times daily., Disp: 20 tablet, Rfl: 0   ENBREL SURECLICK 50 MG/ML injection, Inject into the skin., Disp: , Rfl:     fexofenadine-pseudoephedrine (ALLEGRA-D 24) 180-240 MG 24 hr tablet, Take 1 tablet by mouth daily., Disp: , Rfl:    ipratropium (ATROVENT) 0.03 % nasal spray, Place 2 sprays into both nostrils every 12 (twelve) hours., Disp: 30 mL, Rfl: 12   pantoprazole (PROTONIX) 40 MG tablet, TAKE 1 TABLET BY MOUTH EVERY DAY, Disp: 90 tablet, Rfl: 0   fluticasone (FLONASE) 50 MCG/ACT nasal spray, Place 2 sprays into both nostrils daily. (Patient not taking: Reported on 02/23/2021), Disp: 16 g, Rfl: 6   Meclizine HCl 25 MG CHEW, Chew 1 tablet (25 mg total) by mouth 3 (three) times daily as needed. (Patient not taking: Reported on 02/23/2021), Disp: 15 each, Rfl: 0  Allergies  Allergen Reactions   Codeine Other (See Comments)    childhood   Latex    Sulfa Antibiotics Other (See Comments)    childhood allergy    Objective:   VITALS: Per patient if applicable, see vitals. GENERAL: Alert, appears well and in no acute distress. HEENT: Atraumatic, conjunctiva clear, no obvious abnormalities on inspection of external nose and ears. NECK: Normal movements of the head and neck. CARDIOPULMONARY: No increased WOB. Speaking in clear sentences. I:E ratio WNL.  MS: Moves all visible extremities without noticeable abnormality. PSYCH: Pleasant and cooperative, well-groomed. Speech normal rate and rhythm. Affect is appropriate. Insight and judgement are appropriate. Attention is focused, linear, and appropriate.  NEURO: CN grossly intact. Oriented as arrived to appointment on time with no prompting. Moves both UE equally.  SKIN: No obvious lesions, wounds,  erythema, or cyanosis noted on face or hands.  Assessment and Plan:   [name] was seen today for cough.  Diagnoses and all orders for this visit:  Acute cough No red flags on discussion, patient is not in any obvious distress during our visit. Discussed progression of most viral illness, and recommended supportive care at this point in time. I did however provide  pocket rx for oral augmentin should symptoms not improve as anticipated. Discussed over the counter supportive care options, with recommendations to push fluids and rest. Reviewed return precautions including new/worsening fever, SOB, new/worsening cough or other concerns.  Recommended need to self-quarantine and practice social distancing until symptoms resolve. Discussed current recommendations for COVID testing. I recommend that patient follow-up if symptoms worsen or persist despite treatment x 7-10 days, sooner if needed.   Other orders -     amoxicillin-clavulanate (AUGMENTIN) 875-125 MG tablet; Take 1 tablet by mouth 2 (two) times daily.   I discussed the assessment and treatment plan with the patient. The patient was provided an opportunity to ask questions and all were answered. The patient agreed with the plan and demonstrated an understanding of the instructions.   The patient was advised to call back or seek an in-person evaluation if the symptoms worsen or if the condition fails to improve as anticipated.   CMA or LPN served as scribe during this visit. History, Physical, and Plan performed by medical provider. The above documentation has been reviewed and is accurate and complete.   [place], [name] 02/23/2021

## 2021-03-20 ENCOUNTER — Other Ambulatory Visit: Payer: Self-pay | Admitting: Physician Assistant

## 2021-04-08 ENCOUNTER — Other Ambulatory Visit: Payer: Self-pay | Admitting: Physician Assistant

## 2021-04-27 ENCOUNTER — Encounter: Payer: Self-pay | Admitting: Physician Assistant

## 2021-04-27 ENCOUNTER — Telehealth (INDEPENDENT_AMBULATORY_CARE_PROVIDER_SITE_OTHER): Payer: BC Managed Care – PPO | Admitting: Physician Assistant

## 2021-04-27 VITALS — Temp 98.6°F

## 2021-04-27 DIAGNOSIS — K219 Gastro-esophageal reflux disease without esophagitis: Secondary | ICD-10-CM

## 2021-04-27 DIAGNOSIS — R051 Acute cough: Secondary | ICD-10-CM | POA: Diagnosis not present

## 2021-04-27 MED ORDER — DOXYCYCLINE HYCLATE 100 MG PO TABS: 100.0000 mg | ORAL_TABLET | Freq: Two times a day (BID) | ORAL | 0 refills | Status: DC

## 2021-04-27 MED ORDER — PANTOPRAZOLE SODIUM 40 MG PO TBEC: 40.0000 mg | DELAYED_RELEASE_TABLET | Freq: Every day | ORAL | 0 refills | Status: DC

## 2021-04-27 NOTE — Progress Notes (Signed)
Virtual Visit via Video Note   I, Jarold Motto, connected with  Bruce Knight  ([id number], 03/28/1976) on 04/27/21 at 10:00 AM EST by a video-enabled telemedicine application and verified that I am speaking with the correct person using two identifiers.  Location: Patient: Virtual Visit Location Patient: Home Provider: Virtual Visit Location Provider: Office/Clinic   I discussed the limitations of evaluation and management by telemedicine and the availability of in person appointments. The patient expressed understanding and agreed to proceed.    History of Present Illness: Bruce Knight is a 45 y.o. who identifies as a male who was assigned male at birth, and is being seen today for URI.   HPI   URI Patient reports that multiple family members in his house have been sick.  He states that he developed severe sinus congestion on Monday.  He has a slight cough, ear pressure and sore throat.  He has tried Allegra-D, DayQuil, NyQuil, medicated nasal sprays with slight relief in symptoms.  He denies: Chest pain, shortness of breath, inability to take p.o.'s  GERD Patient needs refill on his 40 mg Protonix.  He tolerates this medication well.  Denies any concerns for this medication at this time.  Denies any uncontrolled symptoms or changes in bowels.  Problems:  Patient Active Problem List   Diagnosis Date Noted   Polyarthralgia 09/06/2018   Seasonal allergies 10/22/2017   Overweight (BMI 25.0-29.9) 09/30/2015   GERD (gastroesophageal reflux disease) 09/30/2015   Rheumatoid arthritis (HCC) 07/05/2015   Adalimumab (Humira) long-term use 07/01/2015    Allergies:  Allergies  Allergen Reactions   Codeine Other (See Comments)    childhood   Latex    Sulfa Antibiotics Other (See Comments)    childhood allergy   Medications:  Current Outpatient Medications:    doxycycline (VIBRA-TABS) 100 MG tablet, Take 1 tablet (100 mg total) by mouth 2 (two) times daily., Disp: 20 tablet,  Rfl: 0   ENBREL SURECLICK 50 MG/ML injection, Inject into the skin., Disp: , Rfl:    fexofenadine-pseudoephedrine (ALLEGRA-D 24) 180-240 MG 24 hr tablet, Take 1 tablet by mouth daily., Disp: , Rfl:    fluticasone (FLONASE) 50 MCG/ACT nasal spray, Place 2 sprays into both nostrils daily., Disp: 16 g, Rfl: 6   ipratropium (ATROVENT) 0.03 % nasal spray, PLACE 2 SPRAYS INTO BOTH NOSTRILS EVERY 12 (TWELVE) HOURS., Disp: 30 mL, Rfl: 2   Meclizine HCl 25 MG CHEW, Chew 1 tablet (25 mg total) by mouth 3 (three) times daily as needed., Disp: 15 each, Rfl: 0   pantoprazole (PROTONIX) 40 MG tablet, Take 1 tablet (40 mg total) by mouth daily., Disp: 90 tablet, Rfl: 0  Observations/Objective: Patient is well-developed, well-nourished in no acute distress.  Resting comfortably  at home.  Head is normocephalic, atraumatic.  No labored breathing.  Speech is clear and coherent with logical content.  Patient is alert and oriented at baseline.    Assessment and Plan: 1. Acute cough No red flags on discussion, patient is not in any obvious distress during our visit. Discussed progression of most viral illness, and recommended supportive care at this point in time. I did however provide pocket rx for oral doxycycline should symptoms not improve as anticipated. Discussed over the counter supportive care options, with recommendations to push fluids and rest. Reviewed return precautions including new/worsening fever, SOB, new/worsening cough or other concerns.  Recommended need to self-quarantine and practice social distancing until symptoms resolve. Discussed current recommendations for COVID testing. I recommend that  patient follow-up if symptoms worsen or persist despite treatment x 7-10 days, sooner if needed.  2. Gastroesophageal reflux disease without esophagitis Well-controlled Continue Protonix 40 mg daily x 3 months Follow-up as needed  Follow Up Instructions: I discussed the assessment and  treatment plan with the patient. The patient was provided an opportunity to ask questions and all were answered. The patient agreed with the plan and demonstrated an understanding of the instructions.  A copy of instructions were sent to the patient via MyChart unless otherwise noted below.   The patient was advised to call back or seek an in-person evaluation if the symptoms worsen or if the condition fails to improve as anticipated.  Jarold Motto, [name]

## 2021-06-09 DIAGNOSIS — Z79899 Other long term (current) drug therapy: Secondary | ICD-10-CM | POA: Diagnosis not present

## 2021-06-09 DIAGNOSIS — R5383 Other fatigue: Secondary | ICD-10-CM | POA: Diagnosis not present

## 2021-06-09 DIAGNOSIS — M0609 Rheumatoid arthritis without rheumatoid factor, multiple sites: Secondary | ICD-10-CM | POA: Diagnosis not present

## 2021-06-09 DIAGNOSIS — M79641 Pain in right hand: Secondary | ICD-10-CM | POA: Diagnosis not present

## 2021-06-09 DIAGNOSIS — M79642 Pain in left hand: Secondary | ICD-10-CM | POA: Diagnosis not present

## 2021-07-03 DIAGNOSIS — R1310 Dysphagia, unspecified: Secondary | ICD-10-CM | POA: Diagnosis not present

## 2021-07-03 DIAGNOSIS — Z20822 Contact with and (suspected) exposure to covid-19: Secondary | ICD-10-CM | POA: Diagnosis not present

## 2021-07-03 DIAGNOSIS — J029 Acute pharyngitis, unspecified: Secondary | ICD-10-CM | POA: Diagnosis not present

## 2021-07-07 ENCOUNTER — Ambulatory Visit: Payer: BC Managed Care – PPO | Admitting: Physician Assistant

## 2021-07-07 ENCOUNTER — Encounter: Payer: Self-pay | Admitting: Physician Assistant

## 2021-07-07 ENCOUNTER — Other Ambulatory Visit: Payer: Self-pay

## 2021-07-07 VITALS — BP 140/91 | HR 83 | Temp 99.0°F | Ht 71.0 in | Wt 195.8 lb

## 2021-07-07 DIAGNOSIS — J069 Acute upper respiratory infection, unspecified: Secondary | ICD-10-CM

## 2021-07-07 NOTE — Progress Notes (Signed)
Bruce Knight is a 46 y.o. male here for a sore throat.  History of Present Illness:   Chief Complaint  Patient presents with   Sore Throat    Present since 02/18. Was seen at urgent care on 02/19, Strep/Covid negative.      Sore Throat [name] presents with c/o sore throat that has been onset since 07/01/21. 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 Following examination he tested for covid and strept but these both resulted as negative. As a result he was prescribed prednisone 40 mg daily x 5 days and recommended to gargle salt water every 2-3 hours. Currently he reports he has completed the medication and gargles salt water in the morning due to dry throat sensation. Additionally he has been experiencing nasal congestion with brown nasal discharge, but believes this is due to particles he came in contact with while working.   Denies fever or chills.    Past Medical History:  Diagnosis Date   Allergic rhinitis    Anxiety    Asthma    GERD (gastroesophageal reflux disease)    Rheumatoid arthritis ([place])      Social History   Tobacco Use   Smoking status: Former   Smokeless tobacco: Never  Substance Use Topics   Alcohol use: Yes    Alcohol/week: 6.0 - 12.0 standard drinks    Types: 6 - 12 Cans of beer per week    Comment: Socially   Drug use: No    Past Surgical History:  Procedure Laterality Date   WISDOM TOOTH EXTRACTION      Family History  Problem Relation Age of Onset   Arthritis Mother    Prostate cancer Father    Colon cancer Neg Hx     Allergies  Allergen Reactions   Codeine Other (See Comments)    childhood   Latex    Sulfa Antibiotics Other (See Comments)    childhood allergy    Current Medications:   Current Outpatient Medications:    ENBREL SURECLICK 50 MG/ML injection, Inject into the skin., Disp: , Rfl:     fexofenadine-pseudoephedrine (ALLEGRA-D 24) 180-240 MG 24 hr tablet, Take 1 tablet by mouth daily., Disp: , Rfl:    fluticasone (FLONASE) 50 MCG/ACT nasal spray, Place 2 sprays into both nostrils daily., Disp: 16 g, Rfl: 6   ipratropium (ATROVENT) 0.03 % nasal spray, PLACE 2 SPRAYS INTO BOTH NOSTRILS EVERY 12 (TWELVE) HOURS., Disp: 30 mL, Rfl: 2   Meclizine HCl 25 MG CHEW, Chew 1 tablet (25 mg total) by mouth 3 (three) times daily as needed., Disp: 15 each, Rfl: 0   pantoprazole (PROTONIX) 40 MG tablet, Take 1 tablet (40 mg total) by mouth daily., Disp: 90 tablet, Rfl: 0   doxycycline (VIBRA-TABS) 100 MG tablet, Take 1 tablet (100 mg total) by mouth 2 (two) times daily. (Patient not taking: Reported on 07/07/2021), Disp: 20 tablet, Rfl: 0   Review of Systems:   ROS Negative unless otherwise specified per HPI. Vitals:   Vitals:   07/07/21 1334  BP: (!) 140/91  Pulse: 83  Temp: 99 F (37.2 C)  TempSrc: Temporal  SpO2: 96%  Weight: 195 lb 12.8 oz (88.8 kg)  Height: 5\' 11"  (1.803 m)     Body mass index is 27.31 kg/m.  Physical Exam:   Physical Exam Vitals and nursing note reviewed.  Constitutional:  General: He is not in acute distress.    Appearance: He is well-developed. He is not ill-appearing or toxic-appearing.  HENT:     Right Ear: Tympanic membrane is erythematous.     Left Ear: Tympanic membrane is erythematous.     Mouth/Throat:     Pharynx: Posterior oropharyngeal erythema present.     Tonsils: No tonsillar exudate. 1+ on the right. 1+ on the left.  Cardiovascular:     Rate and Rhythm: Normal rate and regular rhythm.     Pulses: Normal pulses.     Heart sounds: Normal heart sounds, S1 normal and S2 normal.  Pulmonary:     Effort: Pulmonary effort is normal.     Breath sounds: Normal breath sounds.  Skin:    General: Skin is warm and dry.  Neurological:     Mental Status: He is alert.     GCS: GCS eye subscore is 4. GCS verbal subscore is 5. GCS motor  subscore is 6.  Psychiatric:        Speech: Speech normal.        Behavior: Behavior normal. Behavior is cooperative.    Assessment and Plan:   Viral URI No red flags on exam Patient is improving with time; do not feel as though abx are indicated Recommended continued use of salt water gargle as needed Trial otc lozenges for additional relief Encouraged patient to push more fluids and get rest  Follow up if new/worsening symptoms or concerns occur  I,Havlyn C Ratchford,acting as a scribe for [employer], PA.,have documented all relevant documentation on the behalf of Inda Coke, PA,as directed by  Inda Coke, PA while in the presence of Inda Coke, [place].  I, Inda Coke, [place], have reviewed all documentation for this visit. The documentation on 07/07/21 for the exam, diagnosis, procedures, and orders are all accurate and complete.   Inda Coke, PA-C

## 2021-07-10 ENCOUNTER — Ambulatory Visit: Payer: BC Managed Care – PPO | Admitting: Physician Assistant

## 2021-07-29 ENCOUNTER — Other Ambulatory Visit: Payer: Self-pay | Admitting: Physician Assistant

## 2021-09-26 ENCOUNTER — Encounter: Payer: Self-pay | Admitting: Physician Assistant

## 2021-09-26 ENCOUNTER — Telehealth (INDEPENDENT_AMBULATORY_CARE_PROVIDER_SITE_OTHER): Payer: BC Managed Care – PPO | Admitting: Physician Assistant

## 2021-09-26 ENCOUNTER — Other Ambulatory Visit: Payer: Self-pay | Admitting: *Deleted

## 2021-09-26 VITALS — Ht 71.0 in | Wt 195.0 lb

## 2021-09-26 DIAGNOSIS — K219 Gastro-esophageal reflux disease without esophagitis: Secondary | ICD-10-CM | POA: Diagnosis not present

## 2021-09-26 DIAGNOSIS — R051 Acute cough: Secondary | ICD-10-CM

## 2021-09-26 MED ORDER — PANTOPRAZOLE SODIUM 40 MG PO TBEC: 40.0000 mg | DELAYED_RELEASE_TABLET | Freq: Every day | ORAL | 0 refills | Status: DC

## 2021-09-26 MED ORDER — PREDNISONE 20 MG PO TABS: 40.0000 mg | ORAL_TABLET | Freq: Every day | ORAL | 0 refills | Status: DC

## 2021-09-26 NOTE — Progress Notes (Signed)
? ?I acted as a Education administrator for [employer], PA-C ?Anselmo Pickler, LPN ? ?Virtual Visit via Video Note  ? ?IInda Coke, PA, connected with  [facility]  (PT:[id number], 1975-05-23) on 09/26/21 at 11:20 AM EDT by a video-enabled telemedicine application and verified that I am speaking with the correct person using two identifiers. ? ?Location: ?Patient: Home ?Provider: [place] office ?  ?I discussed the limitations of evaluation and management by telemedicine and the availability of in person appointments. The patient expressed understanding and agreed to proceed.   ? ?History of Present Illness: ?Bruce Knight is a 46 y.o. who identifies as a male who was assigned male at birth, and is being seen today for sinus problem. Pt c/o cough and expectorating yellow sputum, runny nose having clear/yellow drainage. Started on Saturday. Pt did a COVID test on Sat evening was Negative. He is taking Allegra D and nasal spray and Suda-fed. ? ?Denies: fevers, chills, body aches, n/v/d, sick contacts ? ?He also needs refill on his protonix. He is doing well with this medication and needs a refill. ? ?Problems:  ?Patient Active Problem List  ? Diagnosis Date Noted  ? Polyarthralgia 09/06/2018  ? Seasonal allergies 10/22/2017  ? Overweight (BMI 25.0-29.9) 09/30/2015  ? GERD (gastroesophageal reflux disease) 09/30/2015  ? Rheumatoid arthritis ([place]) 07/05/2015  ?  ?Allergies:  ?Allergies  ?Allergen Reactions  ? Codeine Other (See Comments)  ?  childhood  ? Latex   ? Sulfa Antibiotics Other (See Comments)  ?  childhood allergy  ? ?Medications:  ?Current Outpatient Medications:  ?  ENBREL SURECLICK 50 MG/ML injection, Inject into the skin., Disp: , Rfl:  ?  fexofenadine-pseudoephedrine (ALLEGRA-D 24) 180-240 MG 24 hr tablet, Take 1 tablet by mouth daily., Disp: , Rfl:  ?  ipratropium (ATROVENT) 0.03 % nasal spray, PLACE 2 SPRAYS INTO BOTH NOSTRILS EVERY 12 (TWELVE) HOURS., Disp: 30 mL, Rfl: 2 ?  Meclizine HCl 25 MG  CHEW, Chew 1 tablet (25 mg total) by mouth 3 (three) times daily as needed., Disp: 15 each, Rfl: 0 ?  predniSONE (DELTASONE) 20 MG tablet, Take 2 tablets (40 mg total) by mouth daily., Disp: 10 tablet, Rfl: 0 ?  fluticasone (FLONASE) 50 MCG/ACT nasal spray, Place 2 sprays into both nostrils daily. (Patient not taking: Reported on 09/26/2021), Disp: 16 g, Rfl: 6 ?  pantoprazole (PROTONIX) 40 MG tablet, Take 1 tablet (40 mg total) by mouth daily., Disp: 90 tablet, Rfl: 0 ? ?Observations/Objective: ?Patient is well-developed, well-nourished in no acute distress.  ?Resting comfortably  at home.  ?Head is normocephalic, atraumatic.  ?No labored breathing.  ?Speech is clear and coherent with logical content.  ?Patient is alert and oriented at baseline.  ? ? ?Assessment and Plan: ?1. Gastroesophageal reflux disease without esophagitis ?Well controlled ?Continue protonix 40 mg daily ?Follow-up in 6 months, sooner if concerns ? ?2. Acute cough ?No red flags on discussion. Suspect allergies. Will initiate 20 mg oral prednisone BID x 5 days per orders. Discussed taking medications as prescribed. Reviewed return precautions including worsening fever, SOB, worsening cough or other concerns. Push fluids and rest. I recommend that patient follow-up if symptoms worsen or persist despite treatment x 7-10 days, sooner if needed. ? ? ?Follow Up Instructions: ?I discussed the assessment and treatment plan with the patient. The patient was provided an opportunity to ask questions and all were answered. The patient agreed with the plan and demonstrated an understanding of the instructions.  A copy of  instructions were sent to the patient via MyChart unless otherwise noted below.  ? ?The patient was advised to call back or seek an in-person evaluation if the symptoms worsen or if the condition fails to improve as anticipated. ? ?Inda Coke, PA ?

## 2021-10-31 ENCOUNTER — Ambulatory Visit: Payer: BC Managed Care – PPO | Admitting: Family Medicine

## 2021-10-31 ENCOUNTER — Encounter: Payer: Self-pay | Admitting: Family Medicine

## 2021-10-31 VITALS — BP 134/88 | HR 76 | Temp 98.2°F | Ht 71.0 in | Wt 189.4 lb

## 2021-10-31 DIAGNOSIS — J069 Acute upper respiratory infection, unspecified: Secondary | ICD-10-CM

## 2021-10-31 MED ORDER — ALBUTEROL SULFATE HFA 108 (90 BASE) MCG/ACT IN AERS: 2.0000 | INHALATION_SPRAY | Freq: Four times a day (QID) | RESPIRATORY_TRACT | 0 refills | Status: AC | PRN

## 2021-10-31 MED ORDER — PREDNISONE 20 MG PO TABS: 40.0000 mg | ORAL_TABLET | Freq: Every day | ORAL | 0 refills | Status: DC

## 2021-10-31 MED ORDER — AZITHROMYCIN 250 MG PO TABS: ORAL_TABLET | ORAL | 0 refills | Status: AC

## 2021-10-31 NOTE — Patient Instructions (Signed)
Albuterol inhaler if needed Hold Enbrel this week.

## 2021-10-31 NOTE — Progress Notes (Signed)
Subjective:     Patient ID: Antwian Santaana, male    DOB: August 04, 1975, 46 y.o.   MRN: [id number]  Chief Complaint  Patient presents with   Cough    Productive cough that started last night Took Nyquil last night     Nasal Congestion    Runny nose, sinus congestion that started yesterday   Sore Throat    Woke up this morning with dry/sore throat   Fatigue    HPI Chief complaint: cough Symptom onset: yesterday Pertinent positives: runny nose, congestion, sore throat, productive cough-clear/yellow, feeling weak. Wheezing some this am and harder to breathe today-asthma as child. Pertinent negatives: no f/c Treatments tried: otc  Enbrel q Wednesday.    Health Maintenance Due  Topic Date Due   Hepatitis C Screening  Never done    Past Medical History:  Diagnosis Date   Allergic rhinitis    Anxiety    Asthma    GERD (gastroesophageal reflux disease)    Rheumatoid arthritis ([facility])     Past Surgical History:  Procedure Laterality Date   WISDOM TOOTH EXTRACTION      Outpatient Medications Prior to Visit  Medication Sig Dispense Refill   ENBREL SURECLICK 50 MG/ML injection Inject into the skin.     fexofenadine-pseudoephedrine (ALLEGRA-D 24) 180-240 MG 24 hr tablet Take 1 tablet by mouth daily.     ipratropium (ATROVENT) 0.03 % nasal spray PLACE 2 SPRAYS INTO BOTH NOSTRILS EVERY 12 (TWELVE) HOURS. 30 mL 2   Meclizine HCl 25 MG CHEW Chew 1 tablet (25 mg total) by mouth 3 (three) times daily as needed. 15 each 0   pantoprazole (PROTONIX) 40 MG tablet Take 1 tablet (40 mg total) by mouth daily. 90 tablet 0   predniSONE (DELTASONE) 20 MG tablet Take 2 tablets (40 mg total) by mouth daily. 10 tablet 0   fluticasone (FLONASE) 50 MCG/ACT nasal spray Place 2 sprays into both nostrils daily. (Patient not taking: Reported on 09/26/2021) 16 g 6   No facility-administered medications prior to visit.    Allergies  Allergen Reactions   Codeine Other (See Comments)     childhood Other reaction(s): unsure   Latex    Sulfa Antibiotics Other (See Comments)    childhood allergy Other reaction(s): unsure   ROS neg/noncontributory except as noted HPI/below      Objective:     BP 134/88   Pulse 76   Temp 98.2 F (36.8 C) (Temporal)   Ht 5\' 11"  (1.803 m)   Wt 189 lb 6 oz (85.9 kg)   SpO2 98%   BMI 26.41 kg/m  Wt Readings from Last 3 Encounters:  10/31/21 189 lb 6 oz (85.9 kg)  09/26/21 195 lb (88.5 kg)  07/07/21 195 lb 12.8 oz (88.8 kg)    Physical Exam   Gen: WDWN NAD HEENT: NCAT, conjunctiva not injected, sclera nonicteric TM WNL B, OP moist, no exudates but very red  NECK:  supple, no thyromegaly, no nodes, no carotid bruits CARDIAC: RRR, S1S2+, no murmur. LUNGS: CTAB. +insp and exp wheezes R base.  Some exp wheezing diffusely EXT:  no edema MSK: no gross abnormalities.  NEURO: A&O x3.  CN II-XII intact.  PSYCH: normal mood. Good eye contact     Assessment & Plan:   Problem List Items Addressed This Visit   None Visit Diagnoses     Upper respiratory tract infection, unspecified type    -  Primary   Relevant Medications   azithromycin (ZITHROMAX) 250  MG tablet      URI but given sounds on R base, may be more.  Hold enbrel tomorrow.  Will do pred 40mg , albuterol MDI, zp.  Off work 2 days.  Worse, etc. Let know  Meds ordered this encounter  Medications   predniSONE (DELTASONE) 20 MG tablet    Sig: Take 2 tablets (40 mg total) by mouth daily.    Dispense:  10 tablet    Refill:  0   albuterol (VENTOLIN HFA) 108 (90 Base) MCG/ACT inhaler    Sig: Inhale 2 puffs into the lungs every 6 (six) hours as needed for wheezing or shortness of breath.    Dispense:  8 g    Refill:  0   azithromycin (ZITHROMAX) 250 MG tablet    Sig: Take 2 tablets on day 1, then 1 tablet daily on days 2 through 5    Dispense:  6 tablet    Refill:  0    [place], MD

## 2021-11-27 ENCOUNTER — Other Ambulatory Visit: Payer: Self-pay | Admitting: Family Medicine

## 2021-11-27 NOTE — Telephone Encounter (Signed)
Left message to return my call.  

## 2021-11-27 NOTE — Telephone Encounter (Signed)
Patient stated that he did not need refill, didn't use all of the one he had a few weeks ago.

## 2021-12-04 ENCOUNTER — Ambulatory Visit: Payer: BC Managed Care – PPO | Admitting: Internal Medicine

## 2021-12-04 VITALS — BP 138/86 | HR 62 | Temp 98.1°F | Ht 71.0 in | Wt 190.8 lb

## 2021-12-04 DIAGNOSIS — L237 Allergic contact dermatitis due to plants, except food: Secondary | ICD-10-CM

## 2021-12-04 MED ORDER — METHYLPREDNISOLONE ACETATE 80 MG/ML IJ SUSP
80.0000 mg | Freq: Once | INTRAMUSCULAR | Status: AC
  Administered 2021-12-04: 80 mg via INTRAMUSCULAR

## 2021-12-04 MED ORDER — PREDNISONE 20 MG PO TABS: ORAL_TABLET | ORAL | 0 refills | Status: DC

## 2021-12-04 MED ORDER — METHYLPREDNISOLONE ACETATE 40 MG/ML IJ SUSP: 40.0000 mg | Freq: Once | INTRAMUSCULAR | Status: DC

## 2021-12-04 NOTE — Progress Notes (Signed)
   Bruce Knight is a 46 y.o. male who presents today for an office visit.  Assessment/Plan:  New/Acute Problems:      .Contact dermatitis due to poison ivy Limited to arms and legs mainly.  Discussed how poison ivy oil causes contact dermatitis and how to avoid After risk/benefit discussion, we agreed on using 80 mg depomedrol and then a prednisone taper to ameliorate his symptoms.     Subjective:  HPI:  Patient reports getting into poison ivy andgetting rash just like prior poison ivy rash.  He has numerous little bumps on arms and legs.  He was doing yardwork prior to the break out last week. Has tried cortisone cream  Using calamine and aloe vera too but the itchiness.      Objective:  Physical Exam: BP 138/86   Pulse 62   Temp 98.1 F (36.7 C)   Ht 5\' 11"  (1.803 m)   Wt 190 lb 12.8 oz (86.5 kg)   SpO2 99%   BMI 26.61 kg/m   Gen: No acute distress, resting comfortablySkin has numerous mildly red papules often in excoriation (linear) patterns on arms and lbilaterally with no crackles, wheezes, or rhonchi Neuro: Grossly normal, moves all extremities Psych: Normal affect and thought content

## 2021-12-04 NOTE — Assessment & Plan Note (Addendum)
Limited to arms and legs mainly.  Discussed how poison ivy oil causes contact dermatitis and how to avoid After risk/benefit discussion, we agreed on using 80 mg depomedrol and then a prednisone taper to ameliorate his symptoms.

## 2021-12-04 NOTE — Patient Instructions (Signed)
Poison ivy causes the rash where the oil touches the skin- be wary of clothing or other objects that might have touched the plant until they have been washed.  Limit the steroids to minimum necessary since they can cause irritability and even temporary psychosis in high amounts. They should reduce the duration and severity of symptoms but once symptoms are tolerable its ok to stop completely.  Continue using benadryl as tolerated as well.

## 2021-12-11 ENCOUNTER — Encounter: Payer: Self-pay | Admitting: Physician Assistant

## 2021-12-12 ENCOUNTER — Ambulatory Visit: Payer: BC Managed Care – PPO | Admitting: Physician Assistant

## 2021-12-12 ENCOUNTER — Encounter: Payer: Self-pay | Admitting: Physician Assistant

## 2021-12-12 VITALS — BP 140/80 | HR 81 | Temp 98.1°F | Ht 71.0 in | Wt 192.2 lb

## 2021-12-12 DIAGNOSIS — R21 Rash and other nonspecific skin eruption: Secondary | ICD-10-CM

## 2021-12-12 MED ORDER — METHYLPREDNISOLONE ACETATE 80 MG/ML IJ SUSP
80.0000 mg | Freq: Once | INTRAMUSCULAR | Status: AC
  Administered 2021-12-12: 80 mg via INTRAMUSCULAR

## 2021-12-12 MED ORDER — FAMOTIDINE 40 MG PO TABS: 40.0000 mg | ORAL_TABLET | Freq: Every day | ORAL | 0 refills | Status: DC

## 2021-12-12 MED ORDER — PREDNISONE 10 MG PO TABS: ORAL_TABLET | ORAL | 0 refills | Status: DC

## 2021-12-12 NOTE — Progress Notes (Signed)
Bruce Knight is a 46 y.o. male here for a follow up on rash.   History of Present Illness:   Chief Complaint  Patient presents with   Rash    Pt had poison about 2 weeks ago and was treated with steroid but right after that broke out in a rash all over, red and itches. He has been using OTC Calmine Lotion, Aloe vera, Triamcinolone.        HPI  Rash  Patient here to follow-up. He reports he developed rash after getting into poison ivy about 2 weeks ago. States he was broke out into several little bumps on his legs and arm.  The area was itchy and red. He saw Dr, Glenetta Hew on 12/04/2021 and was given 80 mg Depomedrol and was prescribed Prednisone x 7 days.  Today, he states he has completed his steroids about two days ago. Was located on back of his leg. However, he recently broke into rash all over his body. Mostly located chest, arm and legs. His symptoms seems to be progressively getting worse. He is currently using OTC calamine lotion, aloe vera and Triamcinolone. No other specific treatment tried.   States he has bee using Triamcinolone cream 3 times daily and this has been helping. Denies fever or chills. No food allergies. Denies other worsening sx.   Past Medical History:  Diagnosis Date   Allergic rhinitis    Anxiety    Asthma    GERD (gastroesophageal reflux disease)    Rheumatoid arthritis ([facility])      Social History   Tobacco Use   Smoking status: Former   Smokeless tobacco: Never  Substance Use Topics   Alcohol use: Yes    Alcohol/week: 6.0 - 12.0 standard drinks of alcohol    Types: 6 - 12 Cans of beer per week    Comment: Socially   Drug use: No    Past Surgical History:  Procedure Laterality Date   WISDOM TOOTH EXTRACTION      Family History  Problem Relation Age of Onset   Arthritis Mother    Prostate cancer Father    Colon cancer Neg Hx     Allergies  Allergen Reactions   Codeine Other (See Comments)    childhood Other reaction(s):  unsure   Latex    Sulfa Antibiotics Other (See Comments)    childhood allergy Other reaction(s): unsure    Current Medications:   Current Outpatient Medications:    albuterol (VENTOLIN HFA) 108 (90 Base) MCG/ACT inhaler, Inhale 2 puffs into the lungs every 6 (six) hours as needed for wheezing or shortness of breath., Disp: 8 g, Rfl: 0   famotidine (PEPCID) 40 MG tablet, Take 1 tablet (40 mg total) by mouth daily., Disp: 30 tablet, Rfl: 0   fexofenadine-pseudoephedrine (ALLEGRA-D 24) 180-240 MG 24 hr tablet, Take 1 tablet by mouth daily., Disp: , Rfl:    fluticasone (FLONASE) 50 MCG/ACT nasal spray, Place 2 sprays into both nostrils daily., Disp: 16 g, Rfl: 6   ipratropium (ATROVENT) 0.03 % nasal spray, PLACE 2 SPRAYS INTO BOTH NOSTRILS EVERY 12 (TWELVE) HOURS., Disp: 30 mL, Rfl: 2   Meclizine HCl 25 MG CHEW, Chew 1 tablet (25 mg total) by mouth 3 (three) times daily as needed., Disp: 15 each, Rfl: 0   pantoprazole (PROTONIX) 40 MG tablet, Take 1 tablet (40 mg total) by mouth daily., Disp: 90 tablet, Rfl: 0   predniSONE (DELTASONE) 10 MG tablet, Take 20 mg daily (2 tablets) x 1  week, then 10 mg daily (1 tablet) x 1 week, Disp: 21 tablet, Rfl: 0   ENBREL SURECLICK 50 MG/ML injection, Inject into the skin. (Patient not taking: Reported on 12/12/2021), Disp: , Rfl:    Review of Systems:   ROS Negative unless otherwise specified per HPI.   Vitals:   Vitals:   12/12/21 1132  BP: (!) 140/80  Pulse: 81  Temp: 98.1 F (36.7 C)  TempSrc: Temporal  SpO2: 98%  Weight: 192 lb 4 oz (87.2 kg)  Height: 5\' 11"  (1.803 m)     Body mass index is 26.81 kg/m.  Physical Exam:   Physical Exam Vitals and nursing note reviewed.  Constitutional:      Appearance: He is well-developed.  HENT:     Head: Normocephalic.  Eyes:     Conjunctiva/sclera: Conjunctivae normal.     Pupils: Pupils are equal, round, and reactive to light.  Pulmonary:     Effort: Pulmonary effort is normal.   Musculoskeletal:        General: Normal range of motion.     Cervical back: Normal range of motion.  Skin:    General: Skin is warm and dry.     Comments: Erythematous papular rash on bilateral legs and arms  Neurological:     Mental Status: He is alert and oriented to person, place, and time.  Psychiatric:        Behavior: Behavior normal.        Thought Content: Thought content normal.        Judgment: Judgment normal.     Assessment and Plan:   Rash Ongoing Improved and then returned again Suspect sweat and heat may be delaying his healing process Repeat depo-medrol injection Trial longer dose of prednisone -- 20 mg daily x 1 week, then 10 mg daily x 1 week Start 40 mg famotidine and daily antihistamine of choice If persists, follow-up for blood work and possible skin biopsy   I,Savera Zaman,acting as a for Neurosurgeon, PA.,have documented all relevant documentation on the behalf of [name], PA,as directed by  Bruce Motto, PA while in the presence of Bruce Knight, Bruce Knight.   I, [name], Bruce Knight, have reviewed all documentation for this visit. The documentation on 12/12/21 for the exam, diagnosis, procedures, and orders are all accurate and complete.   02/11/22, PA-C

## 2021-12-12 NOTE — Patient Instructions (Signed)
It was great to see you!  Start 20 mg prednisone x 1 week, then 10 mg prednisone x 1 week  Start daily over the counter antihistamine such as Zyrtec (cetirizine), Claritin (loratadine), Allegra (fexofenadine), or Xyzal (levocetirizine). May take benadryl at night for itching and sleep.  Start 40 mg pepcid daily -- I have sent this in but you can get this over the counter if too expensive (this is also called famotidine)  If no improvement, or any worsening, please follow-up. We will likely get blood work and a skin biopsy.  Take care,  Jarold Motto PA-C

## 2021-12-13 ENCOUNTER — Ambulatory Visit: Payer: BC Managed Care – PPO | Admitting: Physician Assistant

## 2022-01-03 ENCOUNTER — Other Ambulatory Visit: Payer: Self-pay | Admitting: Physician Assistant

## 2022-02-06 ENCOUNTER — Other Ambulatory Visit: Payer: Self-pay | Admitting: Physician Assistant

## 2022-02-08 IMAGING — CT CT HEAD W/O CM
3 series · 16 of 47 positions shown, 19 images · non-contrast
Comparison: None.

CLINICAL DATA: Two weeks of dizziness

EXAM:
CT HEAD WITHOUT CONTRAST
TECHNIQUE: Contiguous axial images were obtained from the base of the skull
through the vertex without intravenous contrast.

[Series 2: head wo · axial · 0.44mm/px · z∈[-156,-26]mm · 10 of 32 slices shown, 13 images]
[im 3/32  brain]
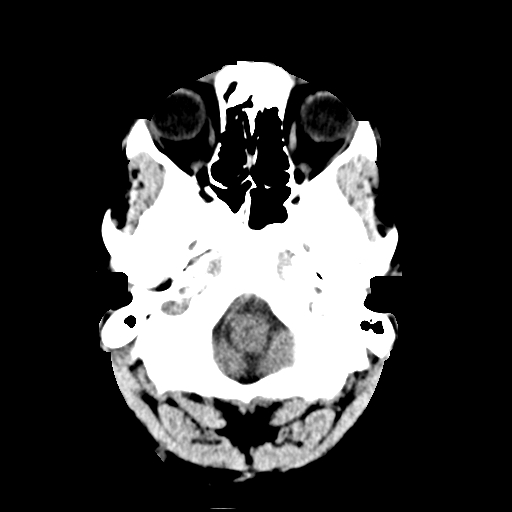
[im 3/32  bone]
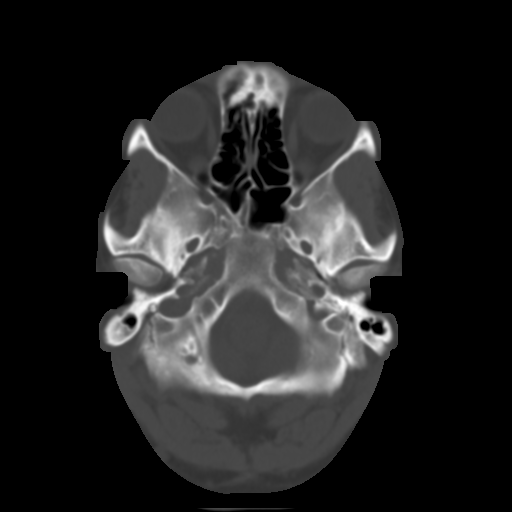
[im 6/32  brain]
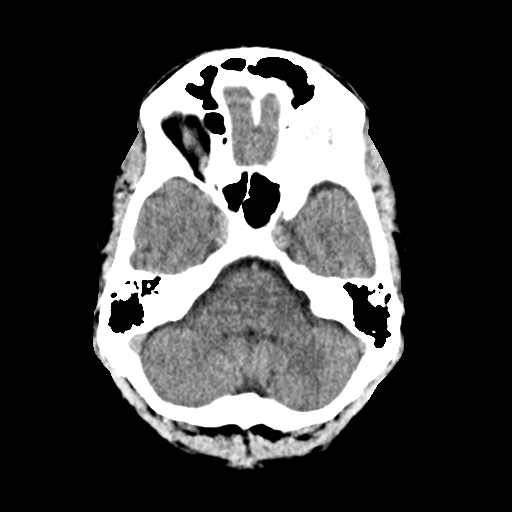
[im 9/32  brain]
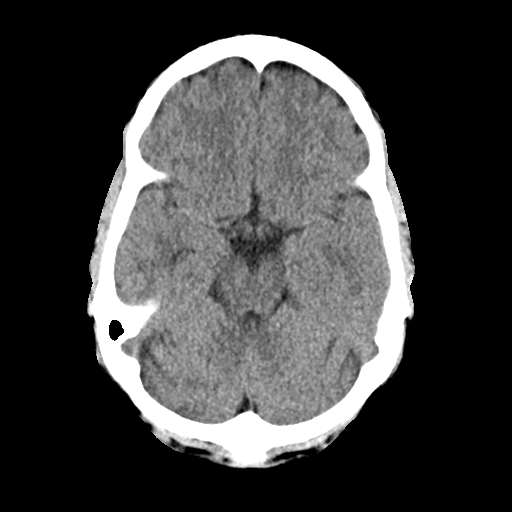
[im 11/32  brain]
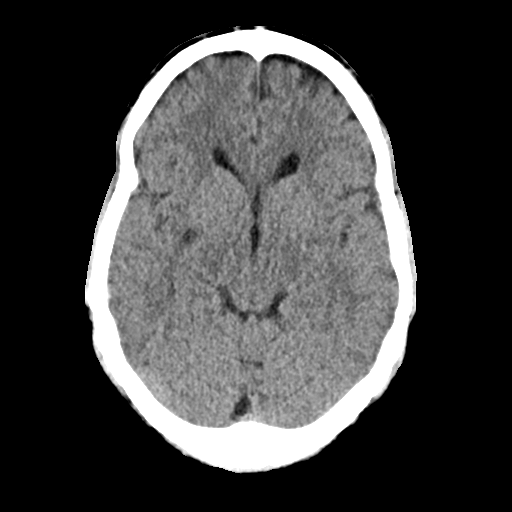
[im 14/32  brain]
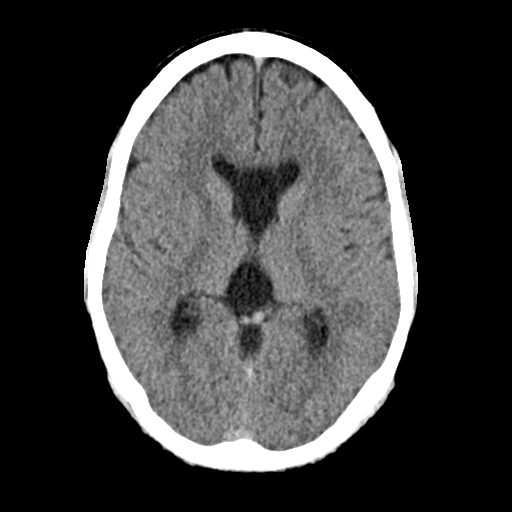
[im 14/32  bone]
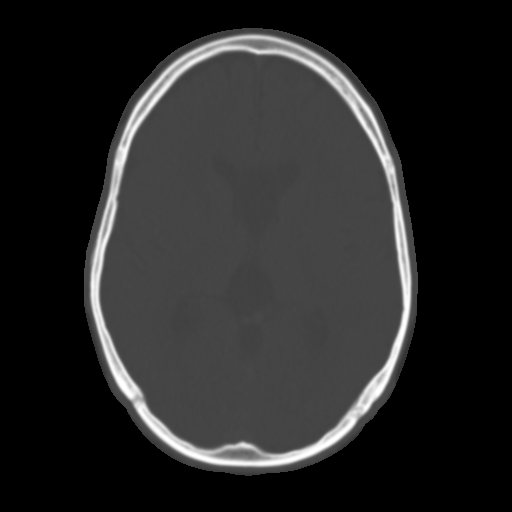
[im 18/32  brain]
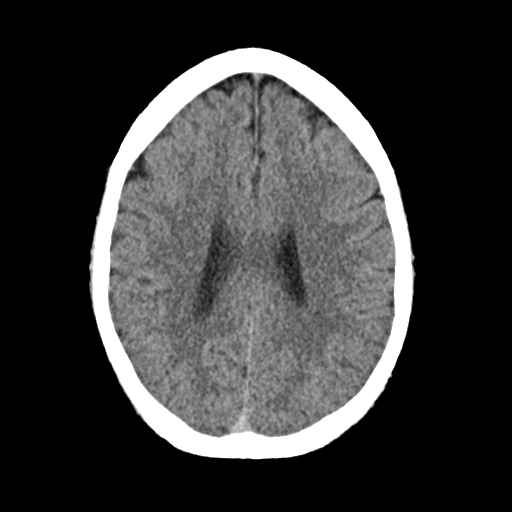
[im 21/32  brain]
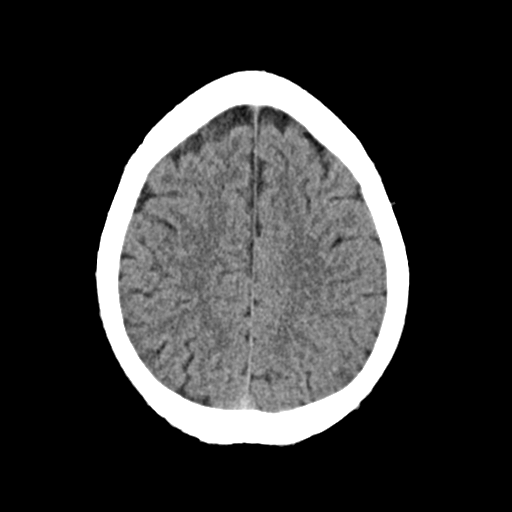
[im 24/32  brain]
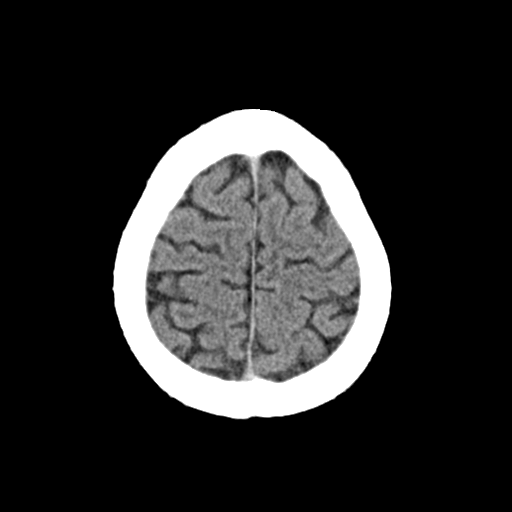
[im 26/32  brain]
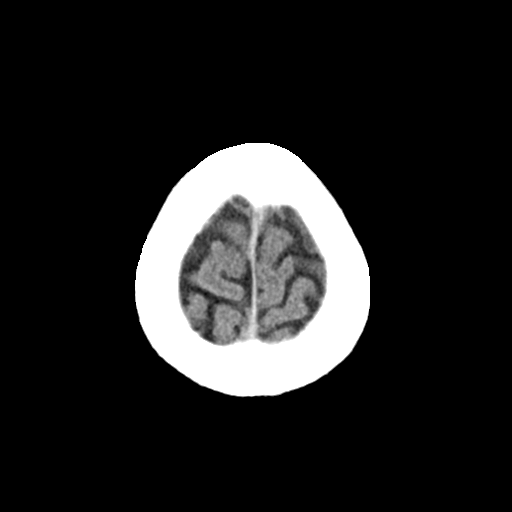
[im 26/32  bone]
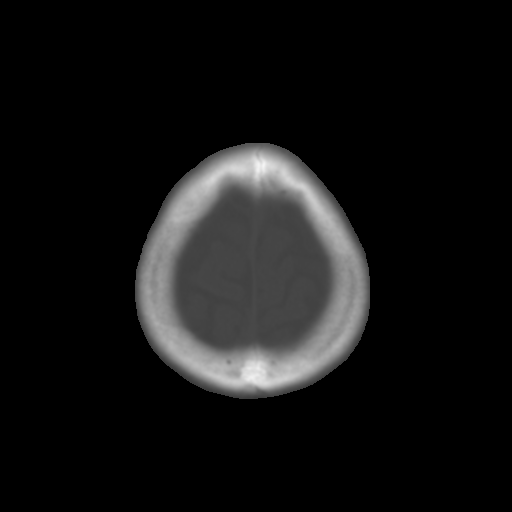
[im 29/32  brain]
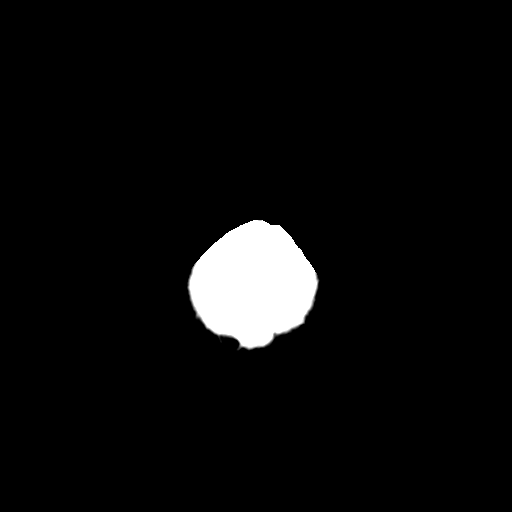

[Series 4: coronal soft · coronal · 0.31mm/px · 3 of 71 slices shown]
[im 24/71  brain]
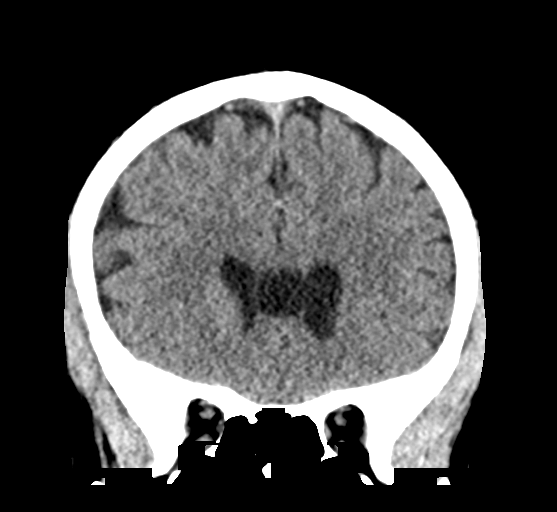
[im 32/71  brain]
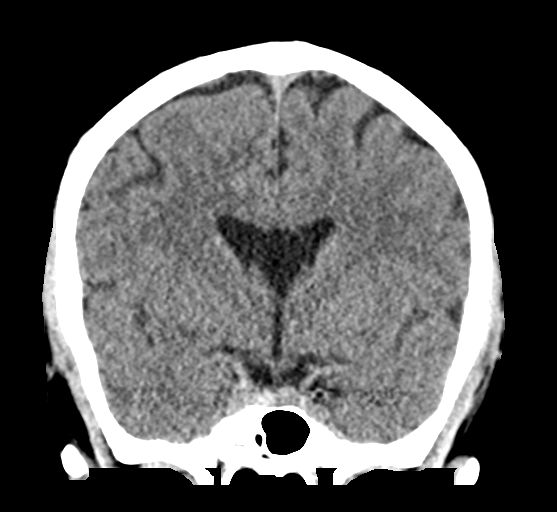
[im 39/71  brain]
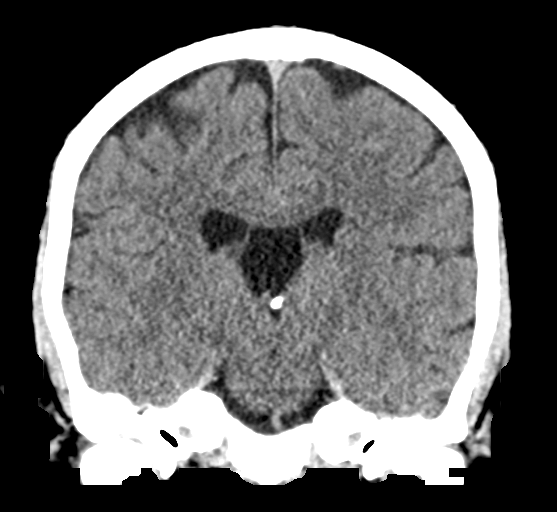

[Series 5: sag soft · sagittal · 0.32mm/px · 3 of 55 slices shown]
[im 19/55  brain]
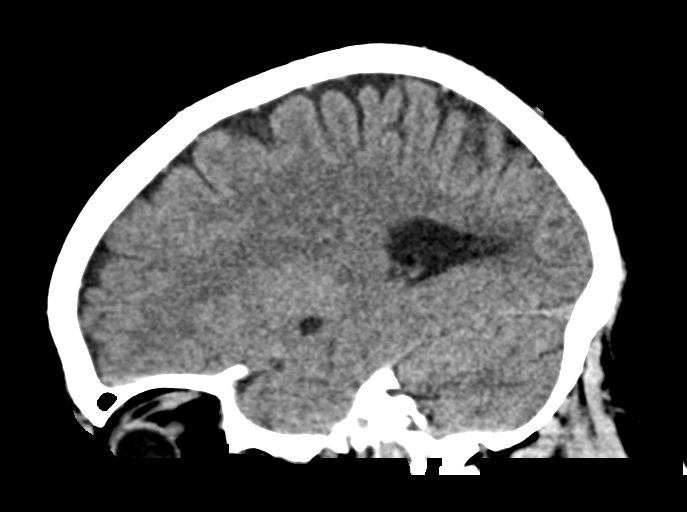
[im 28/55  brain]
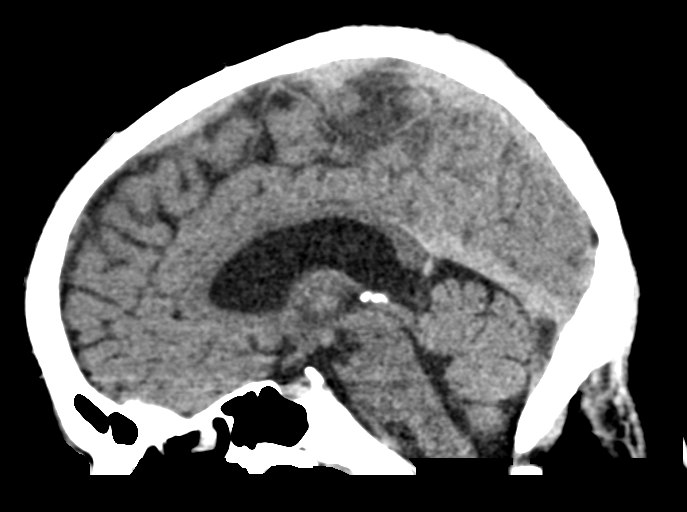
[im 37/55  brain]
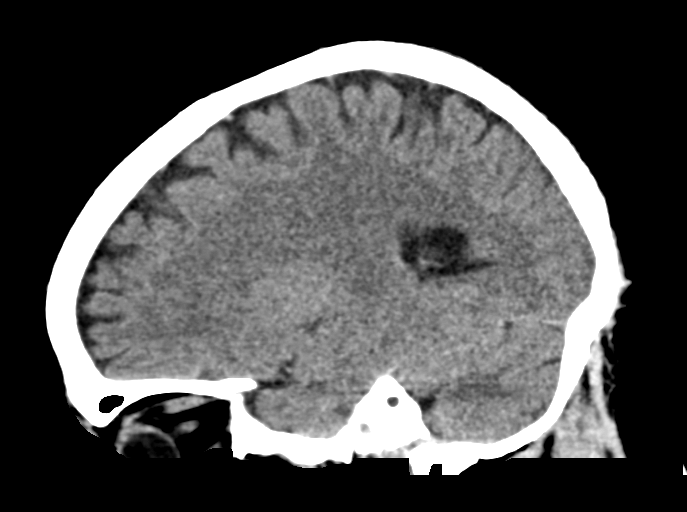

[16 of 47 positions shown; findings below may reference images not displayed]

FINDINGS: Brain: No evidence of acute infarction, hemorrhage, hydrocephalus,
extra-axial collection or mass lesion/mass effect.

Vascular: No hyperdense vessel or unexpected calcification.

Skull: Normal. Negative for fracture or focal lesion.

Sinuses/Orbits: No acute finding.

Other: None
IMPRESSION: Negative non contrasted CT appearance of the brain.

## 2022-02-09 DIAGNOSIS — M79642 Pain in left hand: Secondary | ICD-10-CM | POA: Diagnosis not present

## 2022-02-09 DIAGNOSIS — M0609 Rheumatoid arthritis without rheumatoid factor, multiple sites: Secondary | ICD-10-CM | POA: Diagnosis not present

## 2022-02-09 DIAGNOSIS — M79641 Pain in right hand: Secondary | ICD-10-CM | POA: Diagnosis not present

## 2022-02-09 DIAGNOSIS — Z79899 Other long term (current) drug therapy: Secondary | ICD-10-CM | POA: Diagnosis not present

## 2022-02-19 ENCOUNTER — Other Ambulatory Visit: Payer: Self-pay | Admitting: Physician Assistant

## 2022-03-24 ENCOUNTER — Other Ambulatory Visit: Payer: Self-pay | Admitting: Family Medicine

## 2022-04-25 ENCOUNTER — Encounter: Payer: Self-pay | Admitting: Physician Assistant

## 2022-04-25 ENCOUNTER — Ambulatory Visit: Payer: BC Managed Care – PPO | Admitting: Internal Medicine

## 2022-04-25 ENCOUNTER — Other Ambulatory Visit: Payer: Self-pay

## 2022-04-25 ENCOUNTER — Encounter: Payer: Self-pay | Admitting: Internal Medicine

## 2022-04-25 VITALS — BP 136/94 | HR 86 | Temp 98.7°F | Resp 12 | Ht 71.0 in | Wt 192.8 lb

## 2022-04-25 DIAGNOSIS — L0231 Cutaneous abscess of buttock: Secondary | ICD-10-CM

## 2022-04-25 DIAGNOSIS — L03317 Cellulitis of buttock: Secondary | ICD-10-CM | POA: Diagnosis not present

## 2022-04-25 MED ORDER — DOXYCYCLINE HYCLATE 100 MG PO TABS: 100.0000 mg | ORAL_TABLET | Freq: Two times a day (BID) | ORAL | 1 refills | Status: DC

## 2022-04-25 MED ORDER — KETOROLAC TROMETHAMINE 60 MG/2ML IM SOLN
60.0000 mg | Freq: Once | INTRAMUSCULAR | Status: AC
  Administered 2022-04-25: 60 mg via INTRAMUSCULAR

## 2022-04-26 ENCOUNTER — Other Ambulatory Visit: Payer: Self-pay | Admitting: Physician Assistant

## 2022-04-26 NOTE — Patient Instructions (Signed)
Please call for soon re-eval if not improving within 1-2 days, or go to ER if getting worse.

## 2022-04-26 NOTE — Progress Notes (Signed)
[facility]: [id number]   Routine Medical Office Visit  Patient:  Bruce Knight      Age: 46 y.o.       Sex:  male  Date:   04/26/2022  PCP:    Inda Coke, [place] Provider: Loralee Pacas, MD  Assessment/Plan:   Asia was seen today for hard and painful lump on left buttocks.  Cellulitis and abscess of buttock -     Doxycycline Hyclate; Take 1 tablet (100 mg total) by mouth 2 (two) times daily.  Dispense: 20 tablet; Refill: 1 -     Ketorolac Tromethamine -     WOUND CULTURE; Future    Gave toradol prior to procedure, and local lidocaine during procedure  Procedure Note:  Incision and drainage of abscess Risks, benefits, and alternatives explained and consent obtained. Time out conducted - correct patient and procedure confirmed. Surface cleaned with betadine swab 6 cc lidocaine  infiltrated around abscess. Adequate anesthesia ensured. Area prepped and draped in a sterile fashion. #11 blade used to make a stab incision into abscess of about 0.2 cm, but this has to be later extended to 1 cm to achieve better drainage Then purulent serosanguinous fluid expressed with pressure and collected for cultures.  Curved hemostat used to explore 4 quadrants and loculations broken up- the abscess was complex due to extensive loculations that required extensive subcutaneous exploration with blunt tipped curved hemostat - this was a very large, very complex, very indurated abscess Further purulence expressed. Could not pack  with iodoform gauze due to extensive induration Bactroban covered wound Hemostasis achieved. Bandage applied Patient tolerated the procedure fairly, with  moderate pain and negligible blood loss < 1-2 mL. Additional time was spent (procedure ~45 minutes) trying to mitigate pain with repeat lidocaine injections and toradol injections so that the abscess could be squeezed and more purulence expressed.   Aftercare and  follow-up advised.       Subjective:   Bruce Knight is a 46 y.o. male with the following chart data reviewed: Past Medical History:  Diagnosis Date   Allergic rhinitis    Anxiety    Asthma    GERD (gastroesophageal reflux disease)    Rheumatoid arthritis ([place])    Outpatient Medications Prior to Visit  Medication Sig   albuterol (VENTOLIN HFA) 108 (90 Base) MCG/ACT inhaler Inhale 2 puffs into the lungs every 6 (six) hours as needed for wheezing or shortness of breath.   ENBREL SURECLICK 50 MG/ML injection Inject into the skin.   famotidine (PEPCID) 40 MG tablet TAKE 1 TABLET BY MOUTH EVERY DAY   fexofenadine-pseudoephedrine (ALLEGRA-D 24) 180-240 MG 24 hr tablet Take 1 tablet by mouth daily.   ipratropium (ATROVENT) 0.03 % nasal spray PLACE 2 SPRAYS INTO BOTH NOSTRILS EVERY 12 (TWELVE) HOURS.   pantoprazole (PROTONIX) 40 MG tablet TAKE 1 TABLET BY MOUTH EVERY DAY   fluticasone (FLONASE) 50 MCG/ACT nasal spray Place 2 sprays into both nostrils daily. (Patient not taking: Reported on 04/25/2022)   Meclizine HCl 25 MG CHEW Chew 1 tablet (25 mg total) by mouth 3 (three) times daily as needed. (Patient not taking: Reported on 04/25/2022)   predniSONE (DELTASONE) 10 MG tablet Take 20 mg daily (2 tablets) x 1 week, then 10 mg daily (1 tablet) x 1 week (Patient not taking: Reported on 04/25/2022)   No facility-administered medications prior to visit.     He presented today reporting reason for visit as: Chief Complaint  Patient  presents with   Hard and painful lump on left buttocks    For four days, became worse yesterday.    He can't sit on it to drive and needs work excuse He takes enbrel for rheumatoid arthritis so is immune suppressed by that- but never had anything this severe before in terms of skin infection.       Objective:  Physical Exam  BP (!) 136/94 (BP Location: Right Arm, Patient Position: Sitting)   Pulse 86   Temp 98.7 F (37.1 C) (Temporal)   Resp 12   Ht 5'  11" (1.803 m)   Wt 192 lb 12.8 oz (87.5 kg)   SpO2 100%   BMI 26.89 kg/m  Vital signs reviewed.  Nursing notes reviewed. General Appearance/Constitutional:   Overweight male in no acute distress Musculoskeletal: All extremities are intact.  Neurological:  Awake, alert,  No obvious focal neurological deficits or cognitive impairments Psychiatric:  Appropriate mood, pleasant demeanor Problem-specific findings:  see proc note- large abscess with large whitehead 2 cm covering 60% of right butt cheek but not near the rectum, drained

## 2022-04-27 ENCOUNTER — Ambulatory Visit: Payer: BC Managed Care – PPO | Admitting: Physician Assistant

## 2022-04-27 NOTE — Progress Notes (Shared)
Bruce Knight is a 46 y.o. male here for a new problem.  History of Present Illness:   No chief complaint on file.   HPI  Past Medical History:  Diagnosis Date   Allergic rhinitis    Anxiety    Asthma    GERD (gastroesophageal reflux disease)    Rheumatoid arthritis ([facility])      Social History   Tobacco Use   Smoking status: Former   Smokeless tobacco: Never  Substance Use Topics   Alcohol use: Yes    Alcohol/week: 6.0 - 12.0 standard drinks of alcohol    Types: 6 - 12 Cans of beer per week    Comment: Socially   Drug use: No    Past Surgical History:  Procedure Laterality Date   WISDOM TOOTH EXTRACTION      Family History  Problem Relation Age of Onset   Arthritis Mother    Prostate cancer Father    Colon cancer Neg Hx     Allergies  Allergen Reactions   Codeine Other (See Comments)    childhood Other reaction(s): unsure   Latex    Sulfa Antibiotics Other (See Comments)    childhood allergy Other reaction(s): unsure    Current Medications:   Current Outpatient Medications:    albuterol (VENTOLIN HFA) 108 (90 Base) MCG/ACT inhaler, Inhale 2 puffs into the lungs every 6 (six) hours as needed for wheezing or shortness of breath., Disp: 8 g, Rfl: 0   doxycycline (VIBRA-TABS) 100 MG tablet, Take 1 tablet (100 mg total) by mouth 2 (two) times daily., Disp: 20 tablet, Rfl: 1   ENBREL SURECLICK 50 MG/ML injection, Inject into the skin., Disp: , Rfl:    famotidine (PEPCID) 40 MG tablet, TAKE 1 TABLET BY MOUTH EVERY DAY, Disp: 90 tablet, Rfl: 1   fexofenadine-pseudoephedrine (ALLEGRA-D 24) 180-240 MG 24 hr tablet, Take 1 tablet by mouth daily., Disp: , Rfl:    fluticasone (FLONASE) 50 MCG/ACT nasal spray, Place 2 sprays into both nostrils daily. (Patient not taking: Reported on 04/25/2022), Disp: 16 g, Rfl: 6   ipratropium (ATROVENT) 0.03 % nasal spray, PLACE 2 SPRAYS INTO BOTH NOSTRILS EVERY 12 (TWELVE) HOURS., Disp: 30 mL, Rfl: 2   Meclizine HCl 25 MG CHEW, Chew  1 tablet (25 mg total) by mouth 3 (three) times daily as needed. (Patient not taking: Reported on 04/25/2022), Disp: 15 each, Rfl: 0   pantoprazole (PROTONIX) 40 MG tablet, TAKE 1 TABLET BY MOUTH EVERY DAY, Disp: 90 tablet, Rfl: 0   predniSONE (DELTASONE) 10 MG tablet, Take 20 mg daily (2 tablets) x 1 week, then 10 mg daily (1 tablet) x 1 week (Patient not taking: Reported on 04/25/2022), Disp: 21 tablet, Rfl: 0   Review of Systems:   ROS  Vitals:   There were no vitals filed for this visit.   There is no height or weight on file to calculate BMI.  Physical Exam:   Physical Exam Constitutional:      General: He is not in acute distress.    Appearance: Normal appearance. He is not ill-appearing.  HENT:     Head: Normocephalic and atraumatic.     Right Ear: External ear normal.     Left Ear: External ear normal.  Eyes:     Extraocular Movements: Extraocular movements intact.     Pupils: Pupils are equal, round, and reactive to light.  Cardiovascular:     Rate and Rhythm: Normal rate and regular rhythm.     Heart  sounds: Normal heart sounds. No murmur heard.    No gallop.  Pulmonary:     Effort: Pulmonary effort is normal. No respiratory distress.     Breath sounds: Normal breath sounds. No wheezing or rales.  Skin:    General: Skin is warm and dry.  Neurological:     Mental Status: He is alert and oriented to person, place, and time.  Psychiatric:        Judgment: Judgment normal.     Assessment and Plan:   There are no diagnoses linked to this encounter.   I,Verona Buck,acting as a Neurosurgeon for [name], PA.,have documented all relevant documentation on the behalf of Jarold Motto, PA,as directed by  Jarold Motto, PA while in the presence of Jarold Motto, [name].   Jarold Motto, PA-C

## 2022-04-28 LAB — WOUND CULTURE
MICRO NUMBER:: 14310107
SPECIMEN QUALITY:: ADEQUATE

## 2022-05-11 ENCOUNTER — Ambulatory Visit: Payer: BC Managed Care – PPO | Admitting: Physician Assistant

## 2022-05-11 ENCOUNTER — Encounter: Payer: Self-pay | Admitting: Physician Assistant

## 2022-05-11 VITALS — BP 116/79 | HR 77 | Temp 98.0°F | Ht 71.0 in | Wt 193.4 lb

## 2022-05-11 DIAGNOSIS — H6690 Otitis media, unspecified, unspecified ear: Secondary | ICD-10-CM

## 2022-05-11 MED ORDER — AMOXICILLIN-POT CLAVULANATE 875-125 MG PO TABS: 1.0000 | ORAL_TABLET | Freq: Two times a day (BID) | ORAL | 0 refills | Status: DC

## 2022-05-11 NOTE — Progress Notes (Signed)
Bruce Knight is a 46 y.o. male here for a follow up of a pre-existing problem.  History of Present Illness:   Chief Complaint  Patient presents with   Sore Throat    Pt sx started Monday, pt has been taking sinus medication, pt had procedure done, pt has spot on butt to get lanceted, and he just stopped antibiotics. Pt states allergy medicine and nose spray helped a little. Pt never had fever and states it feels like he had a sinus infection, that's now draining into his chest.    Cough   Fatigue    Sore Throat  Associated symptoms include coughing.  Cough    Patient reports sore throat that started Monday.  Also developed nasal congestion and cough.  He is taken some over-the-counter Allegra-D without significant improvement of symptoms.  Denies fever or chills.  He is having some fatigue and weakness at times.  He is on Enbrel for his rheumatoid arthritis.  He denies any known sick exposures, chest pain, shortness of breath, chest tightness.  Past Medical History:  Diagnosis Date   Allergic rhinitis    Anxiety    Asthma    GERD (gastroesophageal reflux disease)    Rheumatoid arthritis ([facility])      Social History   Tobacco Use   Smoking status: Former   Smokeless tobacco: Never  Substance Use Topics   Alcohol use: Yes    Alcohol/week: 6.0 - 12.0 standard drinks of alcohol    Types: 6 - 12 Cans of beer per week    Comment: Socially   Drug use: No    Past Surgical History:  Procedure Laterality Date   WISDOM TOOTH EXTRACTION      Family History  Problem Relation Age of Onset   Arthritis Mother    Prostate cancer Father    Colon cancer Neg Hx     Allergies  Allergen Reactions   Codeine Other (See Comments)    childhood Other reaction(s): unsure   Latex    Sulfa Antibiotics Other (See Comments)    childhood allergy Other reaction(s): unsure    Current Medications:   Current Outpatient Medications:    albuterol (VENTOLIN HFA) 108 (90 Base) MCG/ACT  inhaler, Inhale 2 puffs into the lungs every 6 (six) hours as needed for wheezing or shortness of breath., Disp: 8 g, Rfl: 0   doxycycline (VIBRA-TABS) 100 MG tablet, Take 1 tablet (100 mg total) by mouth 2 (two) times daily., Disp: 20 tablet, Rfl: 1   ENBREL SURECLICK 50 MG/ML injection, Inject into the skin., Disp: , Rfl:    famotidine (PEPCID) 40 MG tablet, TAKE 1 TABLET BY MOUTH EVERY DAY, Disp: 90 tablet, Rfl: 1   fexofenadine-pseudoephedrine (ALLEGRA-D 24) 180-240 MG 24 hr tablet, Take 1 tablet by mouth daily., Disp: , Rfl:    ipratropium (ATROVENT) 0.03 % nasal spray, PLACE 2 SPRAYS INTO BOTH NOSTRILS EVERY 12 (TWELVE) HOURS., Disp: 30 mL, Rfl: 2   pantoprazole (PROTONIX) 40 MG tablet, TAKE 1 TABLET BY MOUTH EVERY DAY, Disp: 90 tablet, Rfl: 0   Review of Systems:   Review of Systems  Respiratory:  Positive for cough.   Negative unless otherwise specified per HPI.   Vitals:   Vitals:   05/11/22 1131  BP: 116/79  Pulse: 77  Temp: 98 F (36.7 C)  TempSrc: Temporal  SpO2: 96%  Weight: 193 lb 6.4 oz (87.7 kg)  Height: 5\' 11"  (1.803 m)     Body mass index is 26.97 kg/m.  Physical Exam:   Physical Exam Vitals and nursing note reviewed.  Constitutional:      General: He is not in acute distress.    Appearance: He is well-developed. He is not ill-appearing or toxic-appearing.  HENT:     Head: Normocephalic and atraumatic.     Right Ear: Ear canal and external ear normal. A middle ear effusion is present. Tympanic membrane is erythematous and bulging. Tympanic membrane is not retracted.     Left Ear: Tympanic membrane, ear canal and external ear normal. Tympanic membrane is not erythematous, retracted or bulging.     Nose: Nose normal.     Right Sinus: No maxillary sinus tenderness or frontal sinus tenderness.     Left Sinus: No maxillary sinus tenderness or frontal sinus tenderness.     Mouth/Throat:     Pharynx: Uvula midline. No posterior oropharyngeal erythema.  Eyes:      General: Lids are normal.     Conjunctiva/sclera: Conjunctivae normal.  Neck:     Trachea: Trachea normal.  Cardiovascular:     Rate and Rhythm: Normal rate and regular rhythm.     Pulses: Normal pulses.     Heart sounds: Normal heart sounds, S1 normal and S2 normal.  Pulmonary:     Effort: Pulmonary effort is normal.     Breath sounds: Normal breath sounds. No decreased breath sounds, wheezing, rhonchi or rales.  Lymphadenopathy:     Cervical: No cervical adenopathy.  Skin:    General: Skin is warm and dry.  Neurological:     Mental Status: He is alert.     GCS: GCS eye subscore is 4. GCS verbal subscore is 5. GCS motor subscore is 6.  Psychiatric:        Speech: Speech normal.        Behavior: Behavior normal. Behavior is cooperative.     Assessment and Plan:   Acute otitis media, unspecified otitis media type No red flags on exam.  Will initiate augmentin for AOM and sinusitis per orders. Discussed taking medications as prescribed. Reviewed return precautions including worsening fever, SOB, worsening cough or other concerns. Push fluids and rest. I recommend that patient follow-up if symptoms worsen or persist despite treatment x 7-10 days, sooner if needed.    Jarold Motto, PA-C

## 2022-07-06 ENCOUNTER — Encounter: Payer: Self-pay | Admitting: Physician Assistant

## 2022-07-10 ENCOUNTER — Ambulatory Visit (INDEPENDENT_AMBULATORY_CARE_PROVIDER_SITE_OTHER): Payer: BC Managed Care – PPO | Admitting: Physician Assistant

## 2022-07-10 ENCOUNTER — Encounter: Payer: Self-pay | Admitting: Physician Assistant

## 2022-07-10 VITALS — BP 122/80 | HR 75 | Temp 97.8°F | Ht 71.0 in | Wt 198.0 lb

## 2022-07-10 DIAGNOSIS — L03116 Cellulitis of left lower limb: Secondary | ICD-10-CM | POA: Diagnosis not present

## 2022-07-10 MED ORDER — DOXYCYCLINE HYCLATE 100 MG PO TABS: 100.0000 mg | ORAL_TABLET | Freq: Two times a day (BID) | ORAL | 0 refills | Status: DC

## 2022-07-10 NOTE — Progress Notes (Signed)
Bruce Knight is a 47 y.o. male here for a new problem.  History of Present Illness:   Chief Complaint  Patient presents with   Wound Infection    Pt c/o wound on back of left thigh x 5 days, area is red and wound is crusted. Has been antibiotic ointment.    HPI  Wound infection Patient reports skin infection x 5 days He had a similar presentation of symptoms in December and required I&D in our office He had leftover doxycycline at home from this prior I&D and has been taking this for 3 days now, taking 100 mg twice daily He states since starting this medication his symptoms have improved The area has started draining on its own as of yesterday He is also taking Aleve and warm sitz bath's Denies: Fever, chills, nausea, vomiting, malaise, poor appetite, diarrhea  He thinks this may be related to his Enbrel   Past Medical History:  Diagnosis Date   Allergic rhinitis    Anxiety    Asthma    GERD (gastroesophageal reflux disease)    Rheumatoid arthritis ([place])      Social History   Tobacco Use   Smoking status: Former   Smokeless tobacco: Never  Substance Use Topics   Alcohol use: Yes    Alcohol/week: 6.0 - 12.0 standard drinks of alcohol    Types: 6 - 12 Cans of beer per week    Comment: Socially   Drug use: No    Past Surgical History:  Procedure Laterality Date   WISDOM TOOTH EXTRACTION      Family History  Problem Relation Age of Onset   Arthritis Mother    Prostate cancer Father    Colon cancer Neg Hx     Allergies  Allergen Reactions   Codeine Other (See Comments)    childhood Other reaction(s): unsure   Latex    Sulfa Antibiotics Other (See Comments)    childhood allergy Other reaction(s): unsure    Current Medications:   Current Outpatient Medications:    albuterol (VENTOLIN HFA) 108 (90 Base) MCG/ACT inhaler, Inhale 2 puffs into the lungs every 6 (six) hours as needed for wheezing or shortness of breath., Disp: 8 g, Rfl: 0   ENBREL  SURECLICK 50 MG/ML injection, Inject into the skin., Disp: , Rfl:    fexofenadine-pseudoephedrine (ALLEGRA-D 24) 180-240 MG 24 hr tablet, Take 1 tablet by mouth daily., Disp: , Rfl:    ipratropium (ATROVENT) 0.03 % nasal spray, PLACE 2 SPRAYS INTO BOTH NOSTRILS EVERY 12 (TWELVE) HOURS., Disp: 30 mL, Rfl: 2   pantoprazole (PROTONIX) 40 MG tablet, TAKE 1 TABLET BY MOUTH EVERY DAY, Disp: 90 tablet, Rfl: 0   Review of Systems:   ROS Negative unless otherwise specified per HPI.  Vitals:   Vitals:   07/10/22 0910  BP: 122/80  Pulse: 75  Temp: 97.8 F (36.6 C)  TempSrc: Temporal  SpO2: 97%  Weight: 198 lb (89.8 kg)  Height: '5\' 11"'$  (1.803 m)     Body mass index is 27.62 kg/m.  Physical Exam:   Physical Exam Vitals and nursing note reviewed.  Constitutional:      General: He is not in acute distress.    Appearance: He is well-developed. He is not ill-appearing or toxic-appearing.  Cardiovascular:     Rate and Rhythm: Normal rate and regular rhythm.     Pulses: Normal pulses.     Heart sounds: Normal heart sounds, S1 normal and S2 normal.  Pulmonary:  Effort: Pulmonary effort is normal.     Breath sounds: Normal breath sounds.  Skin:    General: Skin is warm and dry.     Comments: Posterior aspect of left thigh with approximately 3 cm area of induration with scant purulent drainage There is surrounding erythema with slight induration of 7 cm Area is not warm  Neurological:     Mental Status: He is alert.     GCS: GCS eye subscore is 4. GCS verbal subscore is 5. GCS motor subscore is 6.  Psychiatric:        Speech: Speech normal.        Behavior: Behavior normal. Behavior is cooperative.      Assessment and Plan:   Cellulitis of left lower extremity No red flags on exam His vitals are stable and he does not appear toxic I gave him an option of continuing doxycycline and seeing how this progresses over the next few days or going ahead and attempting an I&D He has  requested to trial oral antibiotic I have extended his doxycycline for 10 days I recommend that he keep the area covered and gave him materials to do such I also gave him bacitracin ointment to apply to area I recommend NSAIDs for pain and swelling Low threshold to go to ER if any worsening symptoms or signs of sepsis Patient verbalized understanding   Inda Coke, PA-C

## 2022-07-30 ENCOUNTER — Other Ambulatory Visit: Payer: Self-pay | Admitting: Physician Assistant

## 2022-08-31 ENCOUNTER — Encounter: Payer: Self-pay | Admitting: Physician Assistant

## 2022-08-31 ENCOUNTER — Ambulatory Visit: Payer: BC Managed Care – PPO | Admitting: Physician Assistant

## 2022-08-31 VITALS — BP 130/80 | HR 69 | Temp 97.5°F | Ht 71.0 in | Wt 196.5 lb

## 2022-08-31 DIAGNOSIS — R21 Rash and other nonspecific skin eruption: Secondary | ICD-10-CM | POA: Diagnosis not present

## 2022-08-31 MED ORDER — CLOBETASOL PROPIONATE 0.05 % EX CREA: TOPICAL_CREAM | CUTANEOUS | 0 refills | Status: DC

## 2022-08-31 MED ORDER — DOXYCYCLINE HYCLATE 100 MG PO TABS: 100.0000 mg | ORAL_TABLET | Freq: Two times a day (BID) | ORAL | 0 refills | Status: DC

## 2022-08-31 NOTE — Patient Instructions (Signed)
It was great to see you!  Start doxycycline 100 mg twice daily Apply steroid ointment to the area as well  I will place referral to Dermatology  [phone]   Take care,  Jarold Motto PA-C

## 2022-08-31 NOTE — Progress Notes (Signed)
Bruce Knight is a 47 y.o. male here for a new problem.  History of Present Illness:   Chief Complaint  Patient presents with   Cyst    Pt c/o cyst like bumps on left inner wrist and right buttock, started 2 weeks ago    HPI  Cysts Patient is complaining of two cysts, one on right buttocks and the second on his left wrist. He states that he noticed cysts 2 weeks ago and towards the beginning there was a slight white discharge. He reports that he has been taking 100 mg doxcycline once daily for the past 5 days. He denies fever and chills.  He has had similar areas in the past that we have seen. This is the 3rd incident in 4 months.   Past Medical History:  Diagnosis Date   Allergic rhinitis    Anxiety    Asthma    GERD (gastroesophageal reflux disease)    Rheumatoid arthritis      Social History   Tobacco Use   Smoking status: Former   Smokeless tobacco: Never  Substance Use Topics   Alcohol use: Yes    Alcohol/week: 6.0 - 12.0 standard drinks of alcohol    Types: 6 - 12 Cans of beer per week    Comment: Socially   Drug use: No    Past Surgical History:  Procedure Laterality Date   WISDOM TOOTH EXTRACTION      Family History  Problem Relation Age of Onset   Arthritis Mother    Prostate cancer Father    Colon cancer Neg Hx     Allergies  Allergen Reactions   Codeine Other (See Comments)    childhood Other reaction(s): unsure   Latex    Sulfa Antibiotics Other (See Comments)    childhood allergy Other reaction(s): unsure    Current Medications:   Current Outpatient Medications:    albuterol (VENTOLIN HFA) 108 (90 Base) MCG/ACT inhaler, Inhale 2 puffs into the lungs every 6 (six) hours as needed for wheezing or shortness of breath., Disp: 8 g, Rfl: 0   clobetasol cream (TEMOVATE) 0.05 %, Apply to affected area 1-2 times daily, Disp: 30 g, Rfl: 0   doxycycline (VIBRA-TABS) 100 MG tablet, Take 1 tablet (100 mg total) by mouth 2 (two) times daily., Disp:  20 tablet, Rfl: 0   fexofenadine-pseudoephedrine (ALLEGRA-D 24) 180-240 MG 24 hr tablet, Take 1 tablet by mouth daily., Disp: , Rfl:    ipratropium (ATROVENT) 0.03 % nasal spray, PLACE 2 SPRAYS INTO BOTH NOSTRILS EVERY 12 (TWELVE) HOURS., Disp: 30 mL, Rfl: 2   pantoprazole (PROTONIX) 40 MG tablet, TAKE 1 TABLET BY MOUTH EVERY DAY, Disp: 90 tablet, Rfl: 0   Review of Systems:   Review of Systems  Constitutional:  Negative for chills and fever.    Vitals:   Vitals:   08/31/22 1320  BP: 130/80  Pulse: 69  Temp: (!) 97.5 F (36.4 C)  TempSrc: Temporal  SpO2: 99%  Weight: 196 lb 8 oz (89.1 kg)  Height:  (1.803 m)     Body mass index is 27.41 kg/m.  Physical Exam:   Physical Exam Constitutional:      General: He is not in acute distress.    Appearance: Normal appearance. He is not ill-appearing.  HENT:     Head: Normocephalic and atraumatic.     Right Ear: External ear normal.     Left Ear: External ear normal.  Eyes:     Extraocular Movements:  Extraocular movements intact.     Pupils: Pupils are equal, round, and reactive to light.  Cardiovascular:     Rate and Rhythm: Normal rate and regular rhythm.     Heart sounds: Normal heart sounds. No murmur heard.    No gallop.  Pulmonary:     Effort: Pulmonary effort is normal. No respiratory distress.     Breath sounds: Normal breath sounds. No wheezing or rales.  Skin:    General: Skin is warm and dry.     Comments: 1 inch well circumscribed erythematous lesion to R buttock with induration; no fluctuance; slight flaking  1 cm raised erythematous lesion to inner left wrist with induration and no fluctuance  Neurological:     Mental Status: He is alert and oriented to person, place, and time.  Psychiatric:        Judgment: Judgment normal.     Assessment and Plan:   Rash and nonspecific skin eruption No red flags Reviewed briefly with Dr Jacquiline Doe who also evaluated patient Will continue to treat as skin  infection however due to recurrence, question if possible autoimmune sequelae? Start doxyxcycline 100 mg BID x 10 days Trial topical steroid such as clobetasol Dermatology referral also placed If any worsening, asked him to let us know   I,Verona Buck,acting as a Neurosurgeon for [name], PA.,have documented all relevant documentation on the behalf of Jarold Motto, PA,as directed by  Jarold Motto, PA while in the presence of Jarold Motto, [name].   Jarold Motto, PA-C

## 2022-11-02 ENCOUNTER — Other Ambulatory Visit: Payer: Self-pay | Admitting: Physician Assistant

## 2022-11-05 ENCOUNTER — Encounter: Payer: Self-pay | Admitting: Physician Assistant

## 2022-11-05 ENCOUNTER — Ambulatory Visit: Payer: BC Managed Care – PPO | Admitting: Physician Assistant

## 2022-11-05 VITALS — BP 130/80 | HR 74 | Temp 97.8°F | Ht 71.0 in | Wt 193.5 lb

## 2022-11-05 DIAGNOSIS — L03317 Cellulitis of buttock: Secondary | ICD-10-CM | POA: Diagnosis not present

## 2022-11-05 DIAGNOSIS — Z1211 Encounter for screening for malignant neoplasm of colon: Secondary | ICD-10-CM

## 2022-11-05 DIAGNOSIS — K219 Gastro-esophageal reflux disease without esophagitis: Secondary | ICD-10-CM | POA: Diagnosis not present

## 2022-11-05 DIAGNOSIS — R1310 Dysphagia, unspecified: Secondary | ICD-10-CM | POA: Diagnosis not present

## 2022-11-05 MED ORDER — DOXYCYCLINE HYCLATE 100 MG PO TABS
100.0000 mg | ORAL_TABLET | Freq: Two times a day (BID) | ORAL | 0 refills | Status: AC
Start: 2022-11-05 — End: 2022-11-15

## 2022-11-05 NOTE — Progress Notes (Unsigned)
Subjective:    Patient ID: Bruce Knight, male    DOB: Jan 29, 1976, 47 y.o.   MRN: [id number]  Chief Complaint  Patient presents with   Wound Infection    Pt c/o bump left buttock started on Saturday, got a lot of put out of it yesterday.  Area is red and swollen, warm to touch.   Dysphagia    Pt c/o choking on food off and on the past few months.    HPI Patient is in today for concerns of infection on left buttock and also dysphagia. See A/P for details.  Past Medical History:  Diagnosis Date   Allergic rhinitis    Anxiety    Asthma    GERD (gastroesophageal reflux disease)    Rheumatoid arthritis (HCC)     Past Surgical History:  Procedure Laterality Date   WISDOM TOOTH EXTRACTION      Family History  Problem Relation Age of Onset   Arthritis Mother    Prostate cancer Father    Colon cancer Neg Hx     Social History   Tobacco Use   Smoking status: Former   Smokeless tobacco: Never  Substance Use Topics   Alcohol use: Yes    Alcohol/week: 6.0 - 12.0 standard drinks of alcohol    Types: 6 - 12 Cans of beer per week    Comment: Socially   Drug use: No     Allergies  Allergen Reactions   Codeine Other (See Comments)    childhood Other reaction(s): unsure   Latex    Sulfa Antibiotics Other (See Comments)    childhood allergy Other reaction(s): unsure    Review of Systems NEGATIVE UNLESS OTHERWISE INDICATED IN HPI      Objective:     BP 130/80 (BP Location: Left Arm, Patient Position: Sitting, Cuff Size: Large)   Pulse 74   Temp 97.8 F (36.6 C) (Temporal)   Ht 5\' 11"  (1.803 m)   Wt 193 lb 8 oz (87.8 kg)   SpO2 98%   BMI 26.99 kg/m   Wt Readings from Last 3 Encounters:  11/05/22 193 lb 8 oz (87.8 kg)  08/31/22 196 lb 8 oz (89.1 kg)  07/10/22 198 lb (89.8 kg)    BP Readings from Last 3 Encounters:  11/05/22 130/80  08/31/22 130/80  07/10/22 122/80     Physical Exam Vitals and nursing note reviewed.  Constitutional:       Appearance: Normal appearance.  Cardiovascular:     Rate and Rhythm: Normal rate and regular rhythm.  Pulmonary:     Effort: Pulmonary effort is normal.     Breath sounds: Normal breath sounds.  Skin:    Findings: Erythema (left inferior buttock and upper hamstring large area of erythema, with central area of induration, warmth; no current drainage from the site) present.  Neurological:     General: No focal deficit present.     Mental Status: He is alert and oriented to person, place, and time.  Psychiatric:        Mood and Affect: Mood normal.        Behavior: Behavior normal.        Assessment & Plan:  Cellulitis of left buttock -     Doxycycline Hyclate; Take 1 tablet (100 mg total) by mouth 2 (two) times daily for 10 days. Caution of sun-sensitivity.  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 Will start on doxycycline as directed. Warm sitz baths / epsom salt. Monitor area closely. Recheck if worsening pain, redness, fevers, etc. Use Hibiclens or bleach baths to reduce recurrence.  Also mentions on / off dysphagia over last few months. Never anything consistent, but says sometimes food is harder to swallow. GERD hx. Needs colonoscopy too. Will refer to GI for likely scopes. Red flags discussed with him.    Return in about 3 months (around 02/05/2023) for physical, fasting labs .    Aissatou Fronczak M Arriona Prest, PA-C

## 2022-11-05 NOTE — Patient Instructions (Addendum)
Please call [facility] to schedule your colonoscopy. [phone]  Take the doxycycline as directed

## 2022-11-09 ENCOUNTER — Encounter: Payer: Self-pay | Admitting: Physician Assistant

## 2023-01-31 ENCOUNTER — Encounter: Payer: Self-pay | Admitting: Physician Assistant

## 2023-01-31 ENCOUNTER — Other Ambulatory Visit: Payer: Self-pay | Admitting: Physician Assistant

## 2023-01-31 ENCOUNTER — Ambulatory Visit: Payer: BC Managed Care – PPO | Admitting: Physician Assistant

## 2023-01-31 VITALS — BP 128/98 | HR 72 | Ht 69.75 in | Wt 198.4 lb

## 2023-01-31 DIAGNOSIS — R1319 Other dysphagia: Secondary | ICD-10-CM | POA: Diagnosis not present

## 2023-01-31 DIAGNOSIS — Z1211 Encounter for screening for malignant neoplasm of colon: Secondary | ICD-10-CM

## 2023-01-31 DIAGNOSIS — K219 Gastro-esophageal reflux disease without esophagitis: Secondary | ICD-10-CM | POA: Diagnosis not present

## 2023-01-31 MED ORDER — PANTOPRAZOLE SODIUM 40 MG PO TBEC: 40.0000 mg | DELAYED_RELEASE_TABLET | Freq: Two times a day (BID) | ORAL | 5 refills | Status: DC

## 2023-01-31 MED ORDER — NA SULFATE-K SULFATE-MG SULF 17.5-3.13-1.6 GM/177ML PO SOLN: 1.0000 | Freq: Once | ORAL | 0 refills | Status: AC

## 2023-01-31 NOTE — Patient Instructions (Addendum)
You have been scheduled for an endoscopy and colonoscopy. Please follow the written instructions given to you at your visit today.  Please pick up your prep supplies at the pharmacy within the next 1-3 days.  If you use inhalers (even only as needed), please bring them with you on the day of your procedure.  DO NOT TAKE 7 DAYS PRIOR TO TEST- Trulicity (dulaglutide) Ozempic, Wegovy (semaglutide) Mounjaro (tirzepatide) Bydureon Bcise (exanatide extended release)  DO NOT TAKE 1 DAY PRIOR TO YOUR TEST Rybelsus (semaglutide) Adlyxin (lixisenatide) Victoza (liraglutide) Byetta (exanatide) ___________________________________________________________________________   We have sent the following medications to your pharmacy for you to pick up at your convenience: Pantoprazole   Due to recent changes in healthcare laws, you may see the results of your imaging and laboratory studies on MyChart before your provider has had a chance to review them.  We understand that in some cases there may be results that are confusing or concerning to you. Not all laboratory results come back in the same time frame and the provider may be waiting for multiple results in order to interpret others.  Please give [place] 48 hours in order for your provider to thoroughly review all the results before contacting the office for clarification of your results.    I appreciate the  opportunity to care for you  Thank You   Jacelyn Grip

## 2023-01-31 NOTE — Progress Notes (Signed)
Chief Complaint: Dysphagia and GERD  HPI:    Bruce Knight is a 47 year old male with a past medical history as listed below including anxiety, GERD and rheumatoid arthritis, who was referred to me by Jarold Motto, PA for a complaint of dysphagia and GERD.      11/05/2022 patient seen by family medicine for complaint of a bump on his left buttock and dysphagia.  Patient given Doxycycline 100 mg twice daily for 10 days and referred to [place] for dysphagia and reflux.  Also colon cancer screening.    Today, patient presents to clinic and describes that he knows he is due for screening colonoscopy.  In addition to this tells me he has had reflux for years, initially took over-the-counter Omeprazole but after a few years his symptoms got worse and he was started on Pantoprazole 40 mg daily.  This seemed to hold his symptoms for a while but over the past year or so he has had increasing symptoms of difficulty swallowing telling me that often times it feels like his food will not go down at all and occasionally he has to regurgitate it back up.  Sometimes this can last all day where he cannot eat anything.  Sometimes this is set off by tea or Pepsi.  This occurs maybe a couple of times a month.  No nausea or vomiting.    Denies fever, chills, weight loss or blood in his stool.  Past Medical History:  Diagnosis Date   Allergic rhinitis    Anxiety    Asthma    GERD (gastroesophageal reflux disease)    Rheumatoid arthritis (HCC)     Past Surgical History:  Procedure Laterality Date   WISDOM TOOTH EXTRACTION      Current Outpatient Medications  Medication Sig Dispense Refill   albuterol (VENTOLIN HFA) 108 (90 Base) MCG/ACT inhaler Inhale 2 puffs into the lungs every 6 (six) hours as needed for wheezing or shortness of breath. 8 g 0   clobetasol cream (TEMOVATE) 0.05 % Apply to affected area 1-2 times daily 30 g 0   ENBREL SURECLICK 50 MG/ML injection Inject 50 mg into the skin once a week.      fexofenadine-pseudoephedrine (ALLEGRA-D 24) 180-240 MG 24 hr tablet Take 1 tablet by mouth daily.     ipratropium (ATROVENT) 0.03 % nasal spray PLACE 2 SPRAYS INTO BOTH NOSTRILS EVERY 12 (TWELVE) HOURS. 30 mL 2   pantoprazole (PROTONIX) 40 MG tablet TAKE 1 TABLET BY MOUTH EVERY DAY 90 tablet 0   No current facility-administered medications for this visit.    Allergies as of 01/31/2023 - Review Complete 11/05/2022  Allergen Reaction Noted   Codeine Other (See Comments) 07/01/2015   Latex  07/13/2015   Sulfa antibiotics Other (See Comments) 07/01/2015    Family History  Problem Relation Age of Onset   Arthritis Mother    Prostate cancer Father    Colon cancer Neg Hx     Social History   Socioeconomic History   Marital status: Married    Spouse name: Not on file   Number of children: Not on file   Years of education: Not on file   Highest education level: Not on file  Occupational History   Not on file  Tobacco Use   Smoking status: Former   Smokeless tobacco: Never  Substance and Sexual Activity   Alcohol use: Yes    Alcohol/week: 6.0 - 12.0 standard drinks of alcohol    Types: 6 - 12 Cans  of beer per week    Comment: Socially   Drug use: No   Sexual activity: Yes    Birth control/protection: Condom  Other Topics Concern   Not on file  Social History Narrative   Married. Spouse is name is Marchelle Folks. 2 children.   Commercial driver, associates degree.   Caffeine use, daily vitamin   Wear seatbelt, smoke detector in home, exercises at least 3 times a week   Firearms in the home, locked.   Feels safe in relationship.   Social Determinants of Health   Financial Resource Strain: Not on file  Food Insecurity: Not on file  Transportation Needs: Not on file  Physical Activity: Not on file  Stress: Not on file  Social Connections: Not on file  Intimate Partner Violence: Not on file    Review of Systems:    Constitutional: No weight loss, fever or chills Skin: No  rash  Cardiovascular: No chest pain Respiratory: No SOB  Gastrointestinal: See HPI and otherwise negative Genitourinary: No dysuria  Neurological: No headache, dizziness or syncope Musculoskeletal: No new muscle or joint pain Hematologic: No bleeding Psychiatric: No history of depression or anxiety   Physical Exam:  Vital signs: BP (!) 128/98 (BP Location: Left Arm, Patient Position: Sitting, Cuff Size: Large)   Pulse 72   Ht 5' 9.75" (1.772 m) Comment: measured without shoes  Wt 198 lb 6 oz (90 kg)   BMI 28.67 kg/m    Constitutional:   Pleasant Caucasian male appears to be in NAD, Well developed, Well nourished, alert and cooperative Head:  Normocephalic and atraumatic. Eyes:   PEERL, EOMI. No icterus. Conjunctiva pink. Ears:  Normal auditory acuity. Neck:  Supple Throat: Oral cavity and pharynx without inflammation, swelling or lesion.  Respiratory: Respirations even and unlabored. Lungs clear to auscultation bilaterally.   No wheezes, crackles, or rhonchi.  Cardiovascular: Normal S1, S2. No MRG. Regular rate and rhythm. No peripheral edema, cyanosis or pallor.  Gastrointestinal:  Soft, nondistended, nontender. No rebound or guarding. Normal bowel sounds. No appreciable masses or hepatomegaly. Rectal:  Not performed.  Msk:  Symmetrical without gross deformities. Without edema, no deformity or joint abnormality.  Neurologic:  Alert and  oriented x4;  grossly normal neurologically.  Skin:   Dry and intact without significant lesions or rashes. Psychiatric: Demonstrates good judgement and reason without abnormal affect or behaviors.  MOST RECENT LABS AND IMAGING: CBC    Component Value Date/Time   WBC 5.4 04/06/2020 0808   RBC 5.29 04/06/2020 0808   HGB 16.0 04/06/2020 0808   HCT 46.2 04/06/2020 0808   PLT 275 04/06/2020 0808   MCV 87.3 04/06/2020 0808   MCH 30.2 04/06/2020 0808   MCHC 34.6 04/06/2020 0808   RDW 13.0 04/06/2020 0808   LYMPHSABS 1,485 04/06/2020 0808    MONOABS 0.5 10/28/2017 1527   EOSABS 346 04/06/2020 0808   BASOSABS 22 04/06/2020 0808    CMP     Component Value Date/Time   NA 138 03/28/2020 1104   K 3.8 03/28/2020 1104   CL 98 03/28/2020 1104   CO2 31 03/28/2020 1104   GLUCOSE 83 03/28/2020 1104   BUN 16 03/28/2020 1104   CREATININE 1.09 03/28/2020 1104   CALCIUM 9.5 03/28/2020 1104   PROT 6.8 03/28/2020 1104   ALBUMIN 4.0 10/28/2017 1527   AST 10 03/28/2020 1104   ALT 18 03/28/2020 1104   ALKPHOS 60 10/28/2017 1527   BILITOT 0.6 03/28/2020 1104    Assessment: 1.  Screening for colorectal cancer: Patient is 47 and never had screening for colon cancer 2.  GERD: Symptoms for the past 4 to 5 years, initially controlled on Omeprazole, now changed to Pantoprazole 40 mg daily, continues with some reflux; likely contributing to below 3.  Dysphagia: Increasing symptoms over the past 1 to 2 years, sometimes has to regurgitate food and other times feels like he cannot swallow anything for a whole day; likely related to above/peptic stricture versus web versus ring versus other  Plan: 1.  Scheduled patient for a diagnostic EGD in the [facility] with possible dilation as well as a screening colonoscopy with Dr. [name].  Did provide the patient a detailed list of risks for the procedures and he agrees to proceed. Patient is appropriate for endoscopic procedure(s) in the ambulatory ([facility]) setting.  2.  Increase Pantoprazole to 40 mg twice daily, 30-60 minutes before breakfast and dinner.  Prescribed #60 with 5 refills 3.  Reviewed antireflux diet and lifestyle modifications.  Also discussed anti-dysphagia measures. 4.  Patient to follow in clinic per recommendations after time of procedures.  Hyacinth Meeker, PA-C [facility] 01/31/2023, 11:30 AM  Cc: Jarold Motto, [name]

## 2023-02-04 NOTE — Progress Notes (Signed)
I agree with the assessment and plan as outlined by Ms. Lemmon.

## 2023-02-11 ENCOUNTER — Encounter (HOSPITAL_BASED_OUTPATIENT_CLINIC_OR_DEPARTMENT_OTHER): Payer: Self-pay

## 2023-02-11 ENCOUNTER — Ambulatory Visit: Payer: BC Managed Care – PPO | Admitting: Physician Assistant

## 2023-02-11 ENCOUNTER — Encounter: Payer: Self-pay | Admitting: Physician Assistant

## 2023-02-11 ENCOUNTER — Ambulatory Visit (HOSPITAL_BASED_OUTPATIENT_CLINIC_OR_DEPARTMENT_OTHER)
Admission: RE | Admit: 2023-02-11 | Discharge: 2023-02-11 | Disposition: A | Discharge status: Home / Self Care | Payer: BC Managed Care – PPO | Source: Ambulatory Visit | Attending: Physician Assistant | Admitting: Physician Assistant

## 2023-02-11 ENCOUNTER — Other Ambulatory Visit: Payer: Self-pay | Admitting: Physician Assistant

## 2023-02-11 VITALS — BP 124/80 | HR 94 | Temp 97.3°F | Ht 69.75 in | Wt 243.0 lb

## 2023-02-11 DIAGNOSIS — K610 Anal abscess: Secondary | ICD-10-CM

## 2023-02-11 MED ORDER — METRONIDAZOLE 500 MG PO TABS: 500.0000 mg | ORAL_TABLET | Freq: Three times a day (TID) | ORAL | 0 refills | Status: AC

## 2023-02-11 MED ORDER — IOHEXOL 300 MG/ML  SOLN
100.0000 mL | Freq: Once | INTRAMUSCULAR | Status: AC | PRN
  Administered 2023-02-11: 100 mL via INTRAVENOUS

## 2023-02-11 MED ORDER — DOXYCYCLINE HYCLATE 100 MG PO TABS: 100.0000 mg | ORAL_TABLET | Freq: Two times a day (BID) | ORAL | 0 refills | Status: DC

## 2023-02-11 NOTE — Progress Notes (Signed)
Bruce Knight is a 47 y.o. male here for a follow up of a pre-existing problem. History of Present Illness:   Chief Complaint  Patient presents with   Cyst    Pt c/o cyst left buttock, started on Friday, no drainage, painful, red.    HPI  Cyst (Left Buttock): Reports recurrence of painful, red cyst on his left buttock/thigh Friday, 9/27.   He noticed symptom on Friday. Denies fever, chills, nausea and vomiting and diarrhea Significant pain, especially when sitting on this   Prior occurrences: 04/25/22 - left buttocks; underwent I&D and started on doxycycline; culture was positive for MRSA 07/10/22 - back of left thigh: took doxycycline 08/31/22 - left wrist and right buttocks; took doxycyline -- dermatology referred but he never returned call to schedule 11/05/22 - left buttocks; took doxycycline      Past Medical History:  Diagnosis Date   Allergic rhinitis    Anxiety    Asthma    GERD (gastroesophageal reflux disease)    Rheumatoid arthritis (HCC)      Social History   Tobacco Use   Smoking status: Former    Current packs/day: 0.00    Types: Cigarettes    Quit date: 2000    Years since quitting: 24.7   Smokeless tobacco: Never  Vaping Use   Vaping status: Never Used  Substance Use Topics   Alcohol use: Yes    Alcohol/week: 6.0 - 12.0 standard drinks of alcohol    Types: 6 - 12 Cans of beer per week    Comment: 6-12 beers weekly   Drug use: No    Past Surgical History:  Procedure Laterality Date   WISDOM TOOTH EXTRACTION      Family History  Problem Relation Age of Onset   Arthritis Mother    Prostate cancer Father    Skin cancer Father    Skin cancer Brother    Asthma Brother    Other Brother        Alpha-gal   Colon cancer Neg Hx     Allergies  Allergen Reactions   Codeine Other (See Comments)    childhood Other reaction(s): unsure   Latex    Sulfa Antibiotics Other (See Comments)    childhood allergy Other reaction(s): unsure     Current Medications:   Current Outpatient Medications:    albuterol (VENTOLIN HFA) 108 (90 Base) MCG/ACT inhaler, Inhale 2 puffs into the lungs every 6 (six) hours as needed for wheezing or shortness of breath., Disp: 8 g, Rfl: 0   doxycycline (VIBRA-TABS) 100 MG tablet, Take 1 tablet (100 mg total) by mouth 2 (two) times daily., Disp: 14 tablet, Rfl: 0   ENBREL SURECLICK 50 MG/ML injection, Inject 50 mg into the skin once a week., Disp: , Rfl:    fexofenadine-pseudoephedrine (ALLEGRA-D 24) 180-240 MG 24 hr tablet, Take 1 tablet by mouth daily., Disp: , Rfl:    ipratropium (ATROVENT) 0.03 % nasal spray, PLACE 2 SPRAYS INTO BOTH NOSTRILS EVERY 12 (TWELVE) HOURS., Disp: 30 mL, Rfl: 2   metroNIDAZOLE (FLAGYL) 500 MG tablet, Take 1 tablet (500 mg total) by mouth 3 (three) times daily for 7 days., Disp: 21 tablet, Rfl: 0   pantoprazole (PROTONIX) 40 MG tablet, Take 1 tablet (40 mg total) by mouth 2 (two) times daily before a meal., Disp: 60 tablet, Rfl: 5   Review of Systems:   ROS See pertinent positives and negatives as per the HPI.  Vitals:   Vitals:   02/11/23 1029  BP: 124/80  Pulse: 94  Temp: (!) 97.3 F (36.3 C)  TempSrc: Temporal  SpO2: 97%  Weight: 243 lb (110.2 kg)  Height: 5' 9.75" (1.772 m)     Body mass index is 35.12 kg/m.  Physical Exam:   Physical Exam Vitals and nursing note reviewed.  Constitutional:      Appearance: He is well-developed.  HENT:     Head: Normocephalic.  Eyes:     Conjunctiva/sclera: Conjunctivae normal.     Pupils: Pupils are equal, round, and reactive to light.  Pulmonary:     Effort: Pulmonary effort is normal.  Musculoskeletal:        General: Normal range of motion.     Cervical back: Normal range of motion.  Skin:    General: Skin is warm and dry.     Comments: Approximately 2 in erythematous, indurated abscess to left medial buttocks near anus  Neurological:     Mental Status: He is alert and oriented to person, place, and  time.  Psychiatric:        Behavior: Behavior normal.        Thought Content: Thought content normal.        Judgment: Judgment normal.     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   I, Jarold Motto, [name], have reviewed all documentation for this visit. The documentation on 02/11/23 for the exam, diagnosis, procedures, and orders are all accurate and complete.  Jarold Motto, PA-C

## 2023-02-11 NOTE — Patient Instructions (Signed)
It was great to see you!  Please stay by your phone -- we will call you to schedule CT scan  Start both antibiotic(s) now  If any worsening go to the ER  Take care,  Jarold Motto PA-C

## 2023-02-12 ENCOUNTER — Other Ambulatory Visit: Payer: Self-pay | Admitting: *Deleted

## 2023-02-12 DIAGNOSIS — K610 Anal abscess: Secondary | ICD-10-CM

## 2023-02-14 ENCOUNTER — Other Ambulatory Visit: Payer: Self-pay | Admitting: *Deleted

## 2023-02-14 DIAGNOSIS — R9389 Abnormal findings on diagnostic imaging of other specified body structures: Secondary | ICD-10-CM

## 2023-02-21 NOTE — Progress Notes (Signed)
    Bruce Knight D.[facility] [address] [place] [address] Phone: [phone]   Assessment and Plan:     1. 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  Patient had unremarkable physical exam, has no hip pain, no history of hip trauma, non-smoker.  Risk factors include 10 to 15 years of regular EtOH use in his 9s and 30s, intermittent corticosteroid use with past medical history rheumatoid arthritis. - Advised patient to follow-up for repeat imaging if he begins to have right hip pain or if he has loss of range of motion of right.  Otherwise no follow-up necessary   Pertinent previous records reviewed include  CT pelvis 02/11/2023   Follow Up: As needed   Subjective:   I, Bruce Knight, am serving as a Neurosurgeon for Doctor Richardean Sale  Chief Complaint: right hip finding on CT  HPI:   02/22/2023 Patient is a 47 year old male complaining of right hip finding on CT. Patient states he had CT of right hip and he is here to get result from . He has no pain he was just told to come get results.   Relevant Historical Information: Rheumatoid arthritis  Additional pertinent review of systems negative.   Current Outpatient Medications:    albuterol (VENTOLIN HFA) 108 (90 Base) MCG/ACT inhaler, Inhale 2 puffs into the lungs every 6 (six) hours as needed for wheezing or shortness of breath., Disp: 8 g, Rfl: 0   doxycycline (VIBRA-TABS) 100 MG tablet, Take 1 tablet (100 mg total) by mouth 2 (two) times daily., Disp: 14 tablet, Rfl: 0   ENBREL SURECLICK 50 MG/ML injection, Inject 50 mg into the skin once a week., Disp: , Rfl:    fexofenadine-pseudoephedrine (ALLEGRA-D 24) 180-240 MG 24 hr tablet, Take 1  tablet by mouth daily., Disp: , Rfl:    ipratropium (ATROVENT) 0.03 % nasal spray, PLACE 2 SPRAYS INTO BOTH NOSTRILS EVERY 12 (TWELVE) HOURS., Disp: 30 mL, Rfl: 2   pantoprazole (PROTONIX) 40 MG tablet, Take 1 tablet (40 mg total) by mouth 2 (two) times daily before a meal., Disp: 60 tablet, Rfl: 5   Objective:     Vitals:   02/22/23 1404  Pulse: 94  SpO2: 97%  Weight: 196 lb (88.9 kg)  Height: 5\' 9"  (1.753 m)      Body mass index is 28.94 kg/m.    Physical Exam:    General: awake, alert, and oriented no acute distress, nontoxic Skin: no suspicious lesions or rashes Neuro:sensation intact distally with no deficits, normal muscle tone, no atrophy, strength 5/5 in all tested lower ext groups Psych: normal mood and affect, speech clear   Right hip: No deformity, swelling or wasting ROM Flexion 90, ext 30, IR 45, ER 45 NTTP over the hip flexors, greater trochanter, gluteal musculature, si joint, lumbar spine Negative log roll with FROM Negative FABER Negative FADIR Negative Piriformis test Negative trendelenberg Gait normal  No pain with single-leg squat or hop  Electronically signed by:  Bruce Knight D.[facility] 2:49 PM 02/22/23

## 2023-02-22 ENCOUNTER — Ambulatory Visit: Payer: BC Managed Care – PPO | Admitting: Sports Medicine

## 2023-02-22 VITALS — HR 94 | Ht 69.0 in | Wt 196.0 lb

## 2023-02-22 DIAGNOSIS — R9389 Abnormal findings on diagnostic imaging of other specified body structures: Secondary | ICD-10-CM

## 2023-02-22 NOTE — Patient Instructions (Signed)
As needed follow up ,If right hip pain begins or if loss of range of motion in right hip

## 2023-03-20 ENCOUNTER — Telehealth: Payer: Self-pay | Admitting: *Deleted

## 2023-03-20 ENCOUNTER — Encounter: Payer: Self-pay | Admitting: *Deleted

## 2023-03-20 NOTE — Telephone Encounter (Signed)
I called and informed patient that his colon/EGD was moved from 10 am to 2:30pm ,  it was by my mistake but ok to schedule pt at 2:30 by Dr [name]. I had to leave the patient a message

## 2023-03-21 ENCOUNTER — Encounter: Payer: Self-pay | Admitting: Certified Registered Nurse Anesthetist

## 2023-03-22 ENCOUNTER — Encounter: Payer: Self-pay | Admitting: Internal Medicine

## 2023-03-22 ENCOUNTER — Ambulatory Visit: Payer: BC Managed Care – PPO | Admitting: Internal Medicine

## 2023-03-22 VITALS — BP 131/101 | HR 80 | Temp 98.6°F | Resp 11 | Ht 69.0 in | Wt 198.0 lb

## 2023-03-22 DIAGNOSIS — K222 Esophageal obstruction: Secondary | ICD-10-CM | POA: Diagnosis not present

## 2023-03-22 DIAGNOSIS — K200 Eosinophilic esophagitis: Secondary | ICD-10-CM

## 2023-03-22 DIAGNOSIS — D125 Benign neoplasm of sigmoid colon: Secondary | ICD-10-CM

## 2023-03-22 DIAGNOSIS — R1319 Other dysphagia: Secondary | ICD-10-CM | POA: Diagnosis not present

## 2023-03-22 DIAGNOSIS — Z1211 Encounter for screening for malignant neoplasm of colon: Secondary | ICD-10-CM | POA: Diagnosis not present

## 2023-03-22 DIAGNOSIS — K209 Esophagitis, unspecified without bleeding: Secondary | ICD-10-CM | POA: Diagnosis not present

## 2023-03-22 DIAGNOSIS — K2289 Other specified disease of esophagus: Secondary | ICD-10-CM | POA: Diagnosis not present

## 2023-03-22 MED ORDER — SODIUM CHLORIDE 0.9 % IV SOLN: 500.0000 mL | INTRAVENOUS | Status: DC

## 2023-03-22 NOTE — Op Note (Signed)
[facility] Patient Name: Bruce Knight Procedure Date: 03/22/2023 2:16 PM MRN: [id number] Endoscopist: Madelyn Brunner [place] , , [id number] Age: 47 Referring MD:  Date of Birth: Jun 12, 1975 Gender: Male Account #: [id number] Procedure:                Upper GI endoscopy Indications:              Dysphagia, Heartburn Medicines:                Monitored Anesthesia Care Procedure:                Pre-Anesthesia Assessment:                           - Prior to the procedure, a History and Physical                            was performed, and patient medications and                            allergies were reviewed. The patient's tolerance of                            previous anesthesia was also reviewed. The risks                            and benefits of the procedure and the sedation                            options and risks were discussed with the patient.                            All questions were answered, and informed consent                            was obtained. Prior Anticoagulants: The patient has                            taken no anticoagulant or antiplatelet agents. ASA                            Grade Assessment: II - A patient with mild systemic                            disease. After reviewing the risks and benefits,                            the patient was deemed in satisfactory condition to                            undergo the procedure.                           After obtaining informed consent, the endoscope was  passed under direct vision. Throughout the                            procedure, the patient's blood pressure, pulse, and                            oxygen saturations were monitored continuously. The                            Olympus Scope [id number] was introduced through the                            mouth, and advanced to the second part of duodenum.                            The upper GI endoscopy was  accomplished without                            difficulty. The patient tolerated the procedure                            well. Scope In: Scope Out: Findings:                 Mucosal changes including ringed esophagus,                            longitudinal furrows and white plaques were found                            in the entire esophagus. Esophageal findings were                            graded using the Eosinophilic Esophagitis                            Endoscopic Reference Score (EoE-EREFS) as: Edema                            Grade 1 Present (decreased clarity or absence of                            vascular markings), Rings Grade 2 Moderate                            (distinct rings that do not occlude passage of                            diagnostic 8-10 mm endoscope), Exudates Grade 1                            Mild (scattered white lesions involving less than                            10 percent of the  esophageal surface area), Furrows                            Grade 2 Severe (vertical lines with clear depth)                            and Stricture present. Biopsies were obtained from                            the proximal and distal esophagus with cold forceps                            for histology of suspected eosinophilic esophagitis.                           One benign-appearing, intrinsic moderate                            (circumferential scarring or stenosis; an endoscope                            may pass) stenosis was found at the                            gastroesophageal junction. This stenosis measured                            less than one cm (in length). The stenosis was                            traversed. A TTS dilator was passed through the                            scope. Dilation with a 15-16.5-18 mm balloon                            dilator was performed to 15 mm. The dilation site                            was examined and showed  mild mucosal disruption.                           The entire examined stomach was normal.                           The examined duodenum was normal. Complications:            No immediate complications. Estimated Blood Loss:     Estimated blood loss was minimal. Impression:               - Esophageal mucosal changes suggestive of                            eosinophilic esophagitis.                           -  Benign-appearing esophageal stenosis. Dilated.                           - Normal stomach.                           - Normal examined duodenum.                           - Biopsies were taken with a cold forceps for                            evaluation of eosinophilic esophagitis. Recommendation:           - Await pathology results.                           - Continue Protonix BID.                           - Return to GI clinic in 2-3 months.                           - Perform a colonoscopy today. Dr Particia Lather "[name]" [name],  03/22/2023 2:53:20 PM

## 2023-03-22 NOTE — Progress Notes (Signed)
GASTROENTEROLOGY PROCEDURE H&P NOTE   Primary Care Physician: Jarold Motto, PA    Reason for Procedure:   Dysphagia, GERD, colon cancer screening  Plan:    EGD/colonoscopy  Patient is appropriate for endoscopic procedure(s) in the ambulatory ([facility]) setting.  The nature of the procedure, as well as the risks, benefits, and alternatives were carefully and thoroughly reviewed with the patient. Ample time for discussion and questions allowed. The patient understood, was satisfied, and agreed to proceed.     HPI: Bruce Knight is a 47 y.o. male who presents for EGD/colonsocopy for evaluation of dysphagia, GERD, and colon cancer screening.  Patient was most recently seen in the Gastroenterology Clinic on 01/31/23.  No interval change in medical history since that appointment. Please refer to that note for full details regarding GI history and clinical presentation.   Past Medical History:  Diagnosis Date   Allergic rhinitis    Anxiety    Asthma    GERD (gastroesophageal reflux disease)    Rheumatoid arthritis (HCC)     Past Surgical History:  Procedure Laterality Date   WISDOM TOOTH EXTRACTION      Prior to Admission medications   Medication Sig Start Date End Date Taking? Authorizing Provider  fexofenadine-pseudoephedrine (ALLEGRA-D 24) 180-240 MG 24 hr tablet Take 1 tablet by mouth daily.   Yes [provider]  ipratropium (ATROVENT) 0.03 % nasal spray PLACE 2 SPRAYS INTO BOTH NOSTRILS EVERY 12 (TWELVE) HOURS. 04/26/22  Yes Jarold Motto, PA  pantoprazole (PROTONIX) 40 MG tablet Take 1 tablet (40 mg total) by mouth 2 (two) times daily before a meal. 01/31/23  Yes Lemmon, Violet Baldy, PA  albuterol (VENTOLIN HFA) 108 (90 Base) MCG/ACT inhaler Inhale 2 puffs into the lungs every 6 (six) hours as needed for wheezing or shortness of breath. 10/31/21   Jeani Sow, MD  doxycycline (VIBRA-TABS) 100 MG tablet Take 1 tablet (100 mg total) by mouth 2 (two) times  daily. 02/11/23   Jarold Motto, PA  ENBREL SURECLICK 50 MG/ML injection Inject 50 mg into the skin once a week. 10/17/22   [provider]    Current Outpatient Medications  Medication Sig Dispense Refill   fexofenadine-pseudoephedrine (ALLEGRA-D 24) 180-240 MG 24 hr tablet Take 1 tablet by mouth daily.     ipratropium (ATROVENT) 0.03 % nasal spray PLACE 2 SPRAYS INTO BOTH NOSTRILS EVERY 12 (TWELVE) HOURS. 30 mL 2   pantoprazole (PROTONIX) 40 MG tablet Take 1 tablet (40 mg total) by mouth 2 (two) times daily before a meal. 60 tablet 5   albuterol (VENTOLIN HFA) 108 (90 Base) MCG/ACT inhaler Inhale 2 puffs into the lungs every 6 (six) hours as needed for wheezing or shortness of breath. 8 g 0   doxycycline (VIBRA-TABS) 100 MG tablet Take 1 tablet (100 mg total) by mouth 2 (two) times daily. 14 tablet 0   ENBREL SURECLICK 50 MG/ML injection Inject 50 mg into the skin once a week.     Current Facility-Administered Medications  Medication Dose Route Frequency Provider Last Rate Last Admin   0.9 %  sodium chloride infusion  500 mL Intravenous Continuous Imogene Burn, MD        Allergies as of 03/22/2023 - Review Complete 03/22/2023  Allergen Reaction Noted   Codeine Other (See Comments) 07/01/2015   Latex  07/13/2015   Sulfa antibiotics Other (See Comments) 07/01/2015    Family History  Problem Relation Age of Onset   Arthritis Mother    Prostate  cancer Father    Skin cancer Father    Skin cancer Brother    Asthma Brother    Other Brother        Alpha-gal   Colon cancer Neg Hx    Colon polyps Neg Hx    Esophageal cancer Neg Hx    Rectal cancer Neg Hx    Stomach cancer Neg Hx     Social History   Socioeconomic History   Marital status: Married    Spouse name: Not on file   Number of children: 1   Years of education: Not on file   Highest education level: Not on file  Occupational History   Occupation: general contractor/truck driver  Tobacco Use   Smoking  status: Former    Current packs/day: 0.00    Types: Cigarettes    Quit date: 2000    Years since quitting: 24.8   Smokeless tobacco: Never  Vaping Use   Vaping status: Never Used  Substance and Sexual Activity   Alcohol use: Yes    Alcohol/week: 6.0 - 12.0 standard drinks of alcohol    Types: 6 - 12 Cans of beer per week    Comment: 6-12 beers weekly   Drug use: No   Sexual activity: Yes    Birth control/protection: Condom  Other Topics Concern   Not on file  Social History Narrative   Married. Spouse is name is Marchelle Folks. 2 children.   Commercial driver, associates degree.   Caffeine use, daily vitamin   Wear seatbelt, smoke detector in home, exercises at least 3 times a week   Firearms in the home, locked.   Feels safe in relationship.   Social Determinants of Health   Financial Resource Strain: Not on file  Food Insecurity: Not on file  Transportation Needs: Not on file  Physical Activity: Not on file  Stress: Not on file  Social Connections: Not on file  Intimate Partner Violence: Not on file    Physical Exam: Vital signs in last 24 hours: BP (!) 122/93   Pulse 70   Temp 98.6 F (37 C) (Temporal)   Ht 5\' 9"  (1.753 m)   Wt 198 lb (89.8 kg)   SpO2 100%   BMI 29.24 kg/m  GEN: NAD EYE: Sclerae anicteric ENT: MMM CV: Non-tachycardic Pulm: No increased WOB GI: Soft NEURO:  Alert & Oriented   Eulah Pont, MD [facility]   03/22/2023 2:03 PM

## 2023-03-22 NOTE — Progress Notes (Signed)
Called to room to assist during endoscopic procedure.  Patient ID and intended procedure confirmed with present staff. Received instructions for my participation in the procedure from the performing physician.  

## 2023-03-22 NOTE — Progress Notes (Signed)
Report given to PACU, vss 

## 2023-03-22 NOTE — Patient Instructions (Addendum)
Follow post dilation diet today and resume previous medications. Awaiting pathology results. Repeat Colonoscopy date to be determined based on pathology results. Continue Protonix twice daily Return to GI office at scheduled follow up appointment.  YOU HAD AN ENDOSCOPIC PROCEDURE TODAY AT THE [facility]:   Refer to the procedure report that was given to you for any specific questions about what was found during the examination.  If the procedure report does not answer your questions, please call your gastroenterologist to clarify.  If you requested that your care partner not be given the details of your procedure findings, then the procedure report has been included in a sealed envelope for you to review at your convenience later.  YOU SHOULD EXPECT: Some feelings of bloating in the abdomen. Passage of more gas than usual.  Walking can help get rid of the air that was put into your GI tract during the procedure and reduce the bloating. If you had a lower endoscopy (such as a colonoscopy or flexible sigmoidoscopy) you may notice spotting of blood in your stool or on the toilet paper. If you underwent a bowel prep for your procedure, you may not have a normal bowel movement for a few days.  Please Note:  You might notice some irritation and congestion in your nose or some drainage.  This is from the oxygen used during your procedure.  There is no need for concern and it should clear up in a day or so.  SYMPTOMS TO REPORT IMMEDIATELY:  Following lower endoscopy (colonoscopy or flexible sigmoidoscopy):  Excessive amounts of blood in the stool  Significant tenderness or worsening of abdominal pains  Swelling of the abdomen that is new, acute  Fever of 100F or higher  Following upper endoscopy (EGD)  Vomiting of blood or coffee ground material  New chest pain or pain under the shoulder blades  Painful or persistently difficult swallowing  New shortness of breath  Fever of 100F or  higher  Black, tarry-looking stools  For urgent or emergent issues, a gastroenterologist can be reached at any hour by calling [phone]. Do not use MyChart messaging for urgent concerns.    DIET:  We do recommend a small meal at first, but then you may proceed to your regular diet.  Drink plenty of fluids but you should avoid alcoholic beverages for 24 hours.  ACTIVITY:  You should plan to take it easy for the rest of today and you should NOT DRIVE or use heavy machinery until tomorrow (because of the sedation medicines used during the test).    FOLLOW UP: Our staff will call the number listed on your records the next business day following your procedure.  We will call around 7:15- 8:00 am to check on you and address any questions or concerns that you may have regarding the information given to you following your procedure. If we do not reach you, we will leave a message.     If any biopsies were taken you will be contacted by phone or by letter within the next 1-3 weeks.  Please call us at [phone] if you have not heard about the biopsies in 3 weeks.    SIGNATURES/CONFIDENTIALITY: You and/or your care partner have signed paperwork which will be entered into your electronic medical record.  These signatures attest to the fact that that the information above on your After Visit Summary has been reviewed and is understood.  Full responsibility of the confidentiality of this discharge information lies  with you and/or your care-partner.

## 2023-03-22 NOTE — Progress Notes (Signed)
1416 Robinul 0.1 mg IV given due large amount of secretions upon assessment.  MD made aware, vss

## 2023-03-22 NOTE — Op Note (Signed)
[facility] Patient Name: Bruce Knight Procedure Date: 03/22/2023 2:15 PM MRN: [id number] Endoscopist: Madelyn Brunner [place] , , [id number] Age: 47 Referring MD:  Date of Birth: Jun 26, 1975 Gender: Male Account #: [id number] Procedure:                Colonoscopy Indications:              Screening for colorectal malignant neoplasm, This                            is the patient's first colonoscopy Medicines:                Monitored Anesthesia Care Procedure:                Pre-Anesthesia Assessment:                           - Prior to the procedure, a History and Physical                            was performed, and patient medications and                            allergies were reviewed. The patient's tolerance of                            previous anesthesia was also reviewed. The risks                            and benefits of the procedure and the sedation                            options and risks were discussed with the patient.                            All questions were answered, and informed consent                            was obtained. Prior Anticoagulants: The patient has                            taken no anticoagulant or antiplatelet agents. ASA                            Grade Assessment: II - A patient with mild systemic                            disease. After reviewing the risks and benefits,                            the patient was deemed in satisfactory condition to                            undergo the procedure.  After obtaining informed consent, the colonoscope                            was passed under direct vision. Throughout the                            procedure, the patient's blood pressure, pulse, and                            oxygen saturations were monitored continuously. The                            CF [id number] #[id number] was introduced through the anus                            and advanced to the  the terminal ileum. The                            colonoscopy was performed without difficulty. The                            patient tolerated the procedure well. The quality                            of the bowel preparation was excellent. The                            terminal ileum, ileocecal valve, appendiceal                            orifice, and rectum were photographed. Scope In: 2:34:10 PM Scope Out: 2:46:51 PM Scope Withdrawal Time: 0 hours 9 minutes 26 seconds  Total Procedure Duration: 0 hours 12 minutes 41 seconds  Findings:                 The terminal ileum appeared normal.                           A 6 mm polyp was found in the sigmoid colon. The                            polyp was sessile. The polyp was removed with a                            cold snare. Resection and retrieval were complete.                           Non-bleeding internal hemorrhoids were found during                            retroflexion. Complications:            No immediate complications. Estimated Blood Loss:     Estimated blood loss was minimal. Impression:               -  The examined portion of the ileum was normal.                           - One 6 mm polyp in the sigmoid colon, removed with                            a cold snare. Resected and retrieved.                           - Non-bleeding internal hemorrhoids. Recommendation:           - Discharge patient to home (with escort).                           - Await pathology results.                           - The findings and recommendations were discussed                            with the patient. Dr Particia Lather "[name]" [name],  03/22/2023 2:54:46 PM

## 2023-03-25 ENCOUNTER — Telehealth: Payer: Self-pay

## 2023-03-25 NOTE — Telephone Encounter (Signed)
No answer, left message to call if having any issues or concerns, B.Lj Miyamoto RN 

## 2023-03-27 ENCOUNTER — Encounter: Payer: Self-pay | Admitting: Internal Medicine

## 2023-03-27 ENCOUNTER — Telehealth: Payer: Self-pay | Admitting: Internal Medicine

## 2023-03-27 DIAGNOSIS — K219 Gastro-esophageal reflux disease without esophagitis: Secondary | ICD-10-CM

## 2023-03-27 DIAGNOSIS — K200 Eosinophilic esophagitis: Secondary | ICD-10-CM

## 2023-03-27 LAB — SURGICAL PATHOLOGY

## 2023-03-27 MED ORDER — FLUTICASONE PROPIONATE HFA 220 MCG/ACT IN AERO
2.0000 | INHALATION_SPRAY | Freq: Two times a day (BID) | RESPIRATORY_TRACT | 1 refills | Status: AC
Start: 2023-03-27 — End: ?

## 2023-03-27 MED ORDER — PANTOPRAZOLE SODIUM 40 MG PO TBEC
40.0000 mg | DELAYED_RELEASE_TABLET | Freq: Every day | ORAL | 5 refills | Status: AC
Start: 2023-03-27 — End: ?

## 2023-03-27 NOTE — Progress Notes (Signed)
Attempted to call the patient twice without response. I left a voicemail letting him know that I had called on the second attempt.

## 2023-03-27 NOTE — Telephone Encounter (Signed)
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  Patient had already been on Protonix twice daily for 6 weeks prior to his EGD procedure.  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  I went options for treatment with the patient, including topical steroids, 6 food elimination diet, and Dupixent therapy.  Patient opted to start topical steroids for now.  I sent him a prescription for fluticasone 220 mcg for him to swallow 2 puffs twice daily.  Will go ahead and have him decrease his pantoprazole to once daily.  Could consider coming off of pantoprazole completely in the future if this does not make a difference in the patient's symptoms.

## 2023-03-28 NOTE — Telephone Encounter (Signed)
Patient had procedures as scheduled

## 2023-04-30 ENCOUNTER — Ambulatory Visit: Payer: BC Managed Care – PPO | Admitting: Physician Assistant

## 2023-04-30 ENCOUNTER — Encounter: Payer: Self-pay | Admitting: Physician Assistant

## 2023-04-30 VITALS — BP 122/80 | HR 67 | Temp 97.7°F | Ht 69.0 in | Wt 198.0 lb

## 2023-04-30 DIAGNOSIS — L0231 Cutaneous abscess of buttock: Secondary | ICD-10-CM | POA: Diagnosis not present

## 2023-04-30 DIAGNOSIS — L03317 Cellulitis of buttock: Secondary | ICD-10-CM

## 2023-04-30 DIAGNOSIS — L089 Local infection of the skin and subcutaneous tissue, unspecified: Secondary | ICD-10-CM

## 2023-04-30 MED ORDER — DOXYCYCLINE HYCLATE 100 MG PO TABS
100.0000 mg | ORAL_TABLET | Freq: Two times a day (BID) | ORAL | 0 refills | Status: DC
Start: 2023-04-30 — End: 2024-03-27

## 2023-04-30 NOTE — Patient Instructions (Signed)
It was great to see you!  Start doxycycline  [facility] [address], [place], [place] [address] [phone]  Keep me posted if any concerns  Take care,  Jarold Motto PA-C

## 2023-04-30 NOTE — Progress Notes (Signed)
Bruce Knight is a 47 y.o. male here for a follow up of a pre-existing problem.  History of Present Illness:   Chief Complaint  Patient presents with  . Cyst    Pt c/o 1 cyst on right butt cheek, painful.    HPI  Recurrent skin infection He complains of 4-5 skin lesions on his thighs and right buttock.  This is the 3rd/4th time this has occurred this year.  He states this tend to occur whenever he starts taking Enbrel, states he will stop taking this medication.   Notes the bump on his right buttock is slightly irritated due to squeezing it multiple times in the last couple of days.  Denies any fever, chills, nausea, vomiting.   Has not tried anything for his symptom(s)   Past Medical History:  Diagnosis Date  . Allergic rhinitis   . Anxiety   . Asthma   . GERD (gastroesophageal reflux disease)   . Rheumatoid arthritis (HCC)      Social History   Tobacco Use  . Smoking status: Former    Current packs/day: 0.00    Types: Cigarettes    Quit date: 2000    Years since quitting: 24.9  . Smokeless tobacco: Never  Vaping Use  . Vaping status: Never Used  Substance Use Topics  . Alcohol use: Yes    Alcohol/week: 6.0 - 12.0 standard drinks of alcohol    Types: 6 - 12 Cans of beer per week    Comment: 6-12 beers weekly  . Drug use: No    Past Surgical History:  Procedure Laterality Date  . WISDOM TOOTH EXTRACTION      Family History  Problem Relation Age of Onset  . Arthritis Mother   . Prostate cancer Father   . Skin cancer Father   . Skin cancer Brother   . Asthma Brother   . Other Brother        Alpha-gal  . Colon cancer Neg Hx   . Colon polyps Neg Hx   . Esophageal cancer Neg Hx   . Rectal cancer Neg Hx   . Stomach cancer Neg Hx     Allergies  Allergen Reactions  . Codeine Other (See Comments)    childhood Other reaction(s): unsure  . Latex   . Sulfa Antibiotics Other (See Comments)    childhood allergy Other reaction(s): unsure    Current  Medications:   Current Outpatient Medications:  .  albuterol (VENTOLIN HFA) 108 (90 Base) MCG/ACT inhaler, Inhale 2 puffs into the lungs every 6 (six) hours as needed for wheezing or shortness of breath., Disp: 8 g, Rfl: 0 .  fexofenadine-pseudoephedrine (ALLEGRA-D 24) 180-240 MG 24 hr tablet, Take 1 tablet by mouth daily., Disp: , Rfl:  .  fluticasone (FLOVENT HFA) 220 MCG/ACT inhaler, 2 puffs by Other route 2 (two) times daily. Please swallow two puffs twice daily, Disp: 36 g, Rfl: 1 .  ipratropium (ATROVENT) 0.03 % nasal spray, PLACE 2 SPRAYS INTO BOTH NOSTRILS EVERY 12 (TWELVE) HOURS., Disp: 30 mL, Rfl: 2 .  pantoprazole (PROTONIX) 40 MG tablet, Take 1 tablet (40 mg total) by mouth daily., Disp: 60 tablet, Rfl: 5   Review of Systems:   ROS Negative unless otherwise specified per HPI.  Vitals:   Vitals:   04/30/23 0842  BP: 122/80  Pulse: 67  Temp: 97.7 F (36.5 C)  TempSrc: Temporal  SpO2: 99%  Weight: 198 lb (89.8 kg)  Height: 5\' 9"  (1.753 m)  Body mass index is 29.24 kg/m.  Physical Exam:   Physical Exam Vitals and nursing note reviewed.  Constitutional:      Appearance: He is well-developed.  HENT:     Head: Normocephalic.  Eyes:     Conjunctiva/sclera: Conjunctivae normal.     Pupils: Pupils are equal, round, and reactive to light.  Pulmonary:     Effort: Pulmonary effort is normal.  Musculoskeletal:        General: Normal range of motion.     Cervical back: Normal range of motion.  Skin:    General: Skin is warm and dry.     Comments: 2 erythematous pustules to right upper thigh and 2 erythematous pustules to left upper thigh; erythematous pustule to right inner buttock with surrounding erythema  Neurological:     Mental Status: He is alert and oriented to person, place, and time.  Psychiatric:        Behavior: Behavior normal.        Thought Content: Thought content normal.        Judgment: Judgment normal.     Assessment and Plan:   Recurrent  infection of skin Referral to dermatology to help with ongoing management of this recurrent condition; hopefully will also help with preventing from recurring  Cellulitis and abscess of buttock No red flags or systemic symptom(s) No indication for I&D at this time Will treat with oral doxycycline - has responded well to this in the past  Follow-up if new/worsening symptom(s)   I, Isabelle Course, acting as a Neurosurgeon for Jarold Motto, [name]., have documented all relevant documentation on the behalf of Jarold Motto, [name], as directed by  Jarold Motto, PA while in the presence of Jarold Motto, [name].  I, Isabelle Course, have reviewed all documentation for this visit. The documentation on 04/30/23 for the exam, diagnosis, procedures, and orders are all accurate and complete.   Jarold Motto, PA-C

## 2023-05-24 ENCOUNTER — Ambulatory Visit: Payer: BC Managed Care – PPO | Admitting: Internal Medicine

## 2023-07-03 ENCOUNTER — Telehealth: Payer: BC Managed Care – PPO | Admitting: Physician Assistant

## 2023-07-03 ENCOUNTER — Encounter: Payer: Self-pay | Admitting: Physician Assistant

## 2023-07-03 DIAGNOSIS — J101 Influenza due to other identified influenza virus with other respiratory manifestations: Secondary | ICD-10-CM | POA: Diagnosis not present

## 2023-07-03 NOTE — Progress Notes (Signed)
Virtual Visit Consent   Bruce Knight, you are scheduled for a virtual visit with a [facility] provider today. Just as with appointments in the office, your consent must be obtained to participate. Your consent will be active for this visit and any virtual visit you may have with one of our providers in the next 365 days. If you have a MyChart account, a copy of this consent can be sent to you electronically.  As this is a virtual visit, video technology does not allow for your provider to perform a traditional examination. This may limit your provider's ability to fully assess your condition. If your provider identifies any concerns that need to be evaluated in person or the need to arrange testing (such as labs, EKG, etc.), we will make arrangements to do so. Although advances in technology are sophisticated, we cannot ensure that it will always work on either your end or our end. If the connection with a video visit is poor, the visit may have to be switched to a telephone visit. With either a video or telephone visit, we are not always able to ensure that we have a secure connection.  By engaging in this virtual visit, you consent to the provision of healthcare and authorize for your insurance to be billed (if applicable) for the services provided during this visit. Depending on your insurance coverage, you may receive a charge related to this service.  I need to obtain your verbal consent now. Are you willing to proceed with your visit today? Bruce Knight has provided verbal consent on 07/03/2023 for a virtual visit (video or telephone). Bruce Knight, [place]  Date: 07/03/2023 5:54 PM   Virtual Visit via Video Note   I, Bruce Knight, connected with  Bruce Knight  ([id number], 10-Mar-1976) on 07/03/23 at  5:45 PM EST by a video-enabled telemedicine application and verified that I am speaking with the correct person using two identifiers.  Location: Patient: Virtual Visit Location Patient:  Home Provider: Virtual Visit Location Provider: Home Office   I discussed the limitations of evaluation and management by telemedicine and the availability of in person appointments. The patient expressed understanding and agreed to proceed.    History of Present Illness: Bruce Knight is a 48 y.o. who identifies as a male who was assigned male at birth, and is being seen today for flu like symptoms.  HPI: 48 y/o M presents via video telehealth visit for c/o flu like symptoms including fever, cough, body aches x 5 days and recovering slowly currently. Tested positive for flu with at home covid and flu test. Pt is not flu vaccinated. Doing Knight today than yesterday. Requested a work note.   URI     Problems:  Patient Active Problem List   Diagnosis Date Noted   Contact dermatitis due to poison ivy 12/04/2021   Polyarthralgia 09/06/2018   Seasonal allergies 10/22/2017   Overweight (BMI 25.0-29.9) 09/30/2015   GERD (gastroesophageal reflux disease) 09/30/2015   Rheumatoid arthritis (HCC) 07/05/2015    Allergies:  Allergies  Allergen Reactions   Codeine Other (See Comments)    childhood Other reaction(s): unsure   Latex    Sulfa Antibiotics Other (See Comments)    childhood allergy Other reaction(s): unsure   Medications:  Current Outpatient Medications:    albuterol (VENTOLIN HFA) 108 (90 Base) MCG/ACT inhaler, Inhale 2 puffs into the lungs every 6 (six) hours as needed for wheezing or shortness of breath., Disp: 8 g, Rfl: 0   doxycycline (VIBRA-TABS) 100  MG tablet, Take 1 tablet (100 mg total) by mouth 2 (two) times daily., Disp: 28 tablet, Rfl: 0   fexofenadine-pseudoephedrine (ALLEGRA-D 24) 180-240 MG 24 hr tablet, Take 1 tablet by mouth daily., Disp: , Rfl:    fluticasone (FLOVENT HFA) 220 MCG/ACT inhaler, 2 puffs by Other route 2 (two) times daily. Please swallow two puffs twice daily, Disp: 36 g, Rfl: 1   ipratropium (ATROVENT) 0.03 % nasal spray, PLACE 2 SPRAYS INTO BOTH  NOSTRILS EVERY 12 (TWELVE) HOURS., Disp: 30 mL, Rfl: 2   pantoprazole (PROTONIX) 40 MG tablet, Take 1 tablet (40 mg total) by mouth daily., Disp: 60 tablet, Rfl: 5  Observations/Objective: Patient is well-developed, well-nourished in no acute distress.  Resting comfortably  at home.  Head is normocephalic, atraumatic.  No labored breathing.  Speech is clear and coherent with logical content.  Patient is alert and oriented at baseline.    Assessment and Plan: 1. Type A influenza (Primary)  Stay well hydrated. Take otc Mucinex-DM as directed on the box. Continue to watch for worsening symptoms. Schedule another appointment if symptoms don't improve. Pt verbalized understanding and in agreement.    Follow Up Instructions: I discussed the assessment and treatment plan with the patient. The patient was provided an opportunity to ask questions and all were answered. The patient agreed with the plan and demonstrated an understanding of the instructions.  A copy of instructions were sent to the patient via MyChart unless otherwise noted below.   Patient has requested to receive PHI (AVS, Work Notes, etc) pertaining to this video visit through e-mail as they are currently without active MyChart. They have voiced understand that email is not considered secure and their health information could be viewed by someone other than the patient.   The patient was advised to call back or seek an in-person evaluation if the symptoms worsen or if the condition fails to improve as anticipated.    Bruce Better, PA-C

## 2023-07-03 NOTE — Patient Instructions (Signed)
  Adele Barthel, thank you for joining Gilberto Better, PA-C for today's virtual visit.  While this provider is not your primary care provider (PCP), if your PCP is located in our provider database this encounter information will be shared with them immediately following your visit.   A [employer] MyChart account gives you access to today's visit and all your visits, tests, and labs performed at [facility] " click here if you don't have a [facility] MyChart account or go to [email or web address]  Consent: (Patient) Adele Barthel provided verbal consent for this virtual visit at the beginning of the encounter.  Current Medications:  Current Outpatient Medications:    albuterol (VENTOLIN HFA) 108 (90 Base) MCG/ACT inhaler, Inhale 2 puffs into the lungs every 6 (six) hours as needed for wheezing or shortness of breath., Disp: 8 g, Rfl: 0   doxycycline (VIBRA-TABS) 100 MG tablet, Take 1 tablet (100 mg total) by mouth 2 (two) times daily., Disp: 28 tablet, Rfl: 0   fexofenadine-pseudoephedrine (ALLEGRA-D 24) 180-240 MG 24 hr tablet, Take 1 tablet by mouth daily., Disp: , Rfl:    fluticasone (FLOVENT HFA) 220 MCG/ACT inhaler, 2 puffs by Other route 2 (two) times daily. Please swallow two puffs twice daily, Disp: 36 g, Rfl: 1   ipratropium (ATROVENT) 0.03 % nasal spray, PLACE 2 SPRAYS INTO BOTH NOSTRILS EVERY 12 (TWELVE) HOURS., Disp: 30 mL, Rfl: 2   pantoprazole (PROTONIX) 40 MG tablet, Take 1 tablet (40 mg total) by mouth daily., Disp: 60 tablet, Rfl: 5   Medications ordered in this encounter:  No orders of the defined types were placed in this encounter.    *If you need refills on other medications prior to your next appointment, please contact your pharmacy*  Follow-Up: Call back or seek an in-person evaluation if the symptoms worsen or if the condition fails to improve as anticipated.  [facility] [phone]  Other Instructions Stay well hydrated. Take otc  Mucinex-DM as directed on the box. Continue to watch for worsening symptoms. Schedule another appointment if symptoms don't improve.   If you have been instructed to have an in-person evaluation today at a local Urgent Care facility, please use the link below. It will take you to a list of all of our available [facility], including address, phone number and hours of operation. Please do not delay care.  [facility]  If you or a family member do not have a primary care provider, use the link below to schedule a visit and establish care. When you choose a [facility] primary care physician or advanced practice provider, you gain a long-term partner in health. Find a Primary Care Provider  Learn more about [place]'s in-office and virtual care options: [facility] - Get Care Now

## 2024-03-27 ENCOUNTER — Telehealth: Admitting: Physician Assistant

## 2024-03-27 DIAGNOSIS — L03116 Cellulitis of left lower limb: Secondary | ICD-10-CM

## 2024-03-27 MED ORDER — DOXYCYCLINE HYCLATE 100 MG PO TABS
100.0000 mg | ORAL_TABLET | Freq: Two times a day (BID) | ORAL | 0 refills | Status: AC
Start: 2024-03-27 — End: 2024-04-06

## 2024-03-27 NOTE — Progress Notes (Signed)
 Virtual Visit Consent   Bruce Knight, you are scheduled for a virtual visit with a [facility] provider today. Just as with appointments in the office, your consent must be obtained to participate. Your consent will be active for this visit and any virtual visit you may have with one of our providers in the next 365 days. If you have a MyChart account, a copy of this consent can be sent to you electronically.  As this is a virtual visit, video technology does not allow for your provider to perform a traditional examination. This may limit your provider's ability to fully assess your condition. If your provider identifies any concerns that need to be evaluated in person or the need to arrange testing (such as labs, EKG, etc.), we will make arrangements to do so. Although advances in technology are sophisticated, we cannot ensure that it will always work on either your end or our end. If the connection with a video visit is poor, the visit may have to be switched to a telephone visit. With either a video or telephone visit, we are not always able to ensure that we have a secure connection.  By engaging in this virtual visit, you consent to the provision of healthcare and authorize for your insurance to be billed (if applicable) for the services provided during this visit. Depending on your insurance coverage, you may receive a charge related to this service.  I need to obtain your verbal consent now. Are you willing to proceed with your visit today? Bruce Knight has provided verbal consent on 03/27/2024 for a virtual visit (video or telephone). Sean Malinowski, PA-C  Date: 03/27/2024 1:59 PM   Virtual Visit via Video Note   I, Bruce Knight, connected with  Bruce Knight  ([id number], 26-Dec-1975) on 03/27/24 at  1:45 PM EST by a video-enabled telemedicine application and verified that I am speaking with the correct person using two identifiers.  Location: Patient: Virtual Visit Location Patient: Other:  Car Provider: Virtual Visit Location Provider: Home Office   I discussed the limitations of evaluation and management by telemedicine and the availability of in person appointments. The patient expressed understanding and agreed to proceed.    History of Present Illness: Bruce Knight is a 48 y.o. who identifies as a male who was assigned male at birth, and is being seen today for sore on left thigh.  HPI: Patient presents for evaluation of a sore bump on the left thigh for the last 5 days.  Reports a few months ago he had similar symptoms, he had painful bumps on the leg and in the buttocks area, was treated with antibiotics with resolution of symptoms.  Denies using hot tubs, known history of MRSA.  No change in medications and or use of other body products.  He did recently try to purchase over-the-counter acne medications, exfoliation cleanser without any significant relief. Patient reports that the area on the left thigh is red, warm to touch and tender, no drainage reported.      Problems:  Patient Active Problem List   Diagnosis Date Noted   Contact dermatitis due to poison ivy 12/04/2021   Polyarthralgia 09/06/2018   Seasonal allergies 10/22/2017   Overweight (BMI 25.0-29.9) 09/30/2015   GERD (gastroesophageal reflux disease) 09/30/2015   Rheumatoid arthritis (HCC) 07/05/2015    Allergies:  Allergies  Allergen Reactions   Codeine Other (See Comments)    childhood Other reaction(s): unsure   Latex    Sulfa Antibiotics Other (See Comments)  childhood allergy Other reaction(s): unsure   Medications:  Current Outpatient Medications:    doxycycline  (VIBRA -TABS) 100 MG tablet, Take 1 tablet (100 mg total) by mouth 2 (two) times daily for 10 days., Disp: 20 tablet, Rfl: 0   albuterol  (VENTOLIN  HFA) 108 (90 Base) MCG/ACT inhaler, Inhale 2 puffs into the lungs every 6 (six) hours as needed for wheezing or shortness of breath., Disp: 8 g, Rfl: 0   fexofenadine-pseudoephedrine  (ALLEGRA-D 24) 180-240 MG 24 hr tablet, Take 1 tablet by mouth daily., Disp: , Rfl:    fluticasone  (FLOVENT  HFA) 220 MCG/ACT inhaler, 2 puffs by Other route 2 (two) times daily. Please swallow two puffs twice daily, Disp: 36 g, Rfl: 1   ipratropium (ATROVENT ) 0.03 % nasal spray, PLACE 2 SPRAYS INTO BOTH NOSTRILS EVERY 12 (TWELVE) HOURS., Disp: 30 mL, Rfl: 2   pantoprazole  (PROTONIX ) 40 MG tablet, Take 1 tablet (40 mg total) by mouth daily., Disp: 60 tablet, Rfl: 5  Observations/Objective: Patient is well-developed, well-nourished in no acute distress.  Resting comfortably sitting in car.  Head is normocephalic, atraumatic.  No labored breathing.  Speech is clear and coherent with logical content.  Patient is alert and oriented at baseline. Left outer thigh with a central scab, surrounding erythema, no purulent drainage noted, scarring noted from previous lesions   Assessment and Plan: 1. Cellulitis of left lower extremity (Primary) - doxycycline  (VIBRA -TABS) 100 MG tablet; Take 1 tablet (100 mg total) by mouth 2 (two) times daily for 10 days.  Dispense: 20 tablet; Refill: 0    Follow Up Instructions: I discussed the assessment and treatment plan with the patient. The patient was provided an opportunity to ask questions and all were answered. The patient agreed with the plan and demonstrated an understanding of the instructions.  A copy of instructions were sent to the patient via MyChart unless otherwise noted below.     The patient was advised to call back or seek an in-person evaluation if the symptoms worsen or if the condition fails to improve as anticipated.    Joane Postel, PA-C

## 2024-03-27 NOTE — Patient Instructions (Addendum)
  Bruce Knight, thank you for joining Atasha Colebank, PA-C for today's virtual visit.  While this provider is not your primary care provider (PCP), if your PCP is located in our provider database this encounter information will be shared with them immediately following your visit.   A [facility] MyChart account gives you access to today's visit and all your visits, tests, and labs performed at [facility]  click here if you don't have a [facility] MyChart account or go to [email or web address]  Consent: (Patient) Bruce Knight provided verbal consent for this virtual visit at the beginning of the encounter.  Current Medications:  Current Outpatient Medications:    doxycycline  (VIBRA -TABS) 100 MG tablet, Take 1 tablet (100 mg total) by mouth 2 (two) times daily for 10 days., Disp: 20 tablet, Rfl: 0   albuterol  (VENTOLIN  HFA) 108 (90 Base) MCG/ACT inhaler, Inhale 2 puffs into the lungs every 6 (six) hours as needed for wheezing or shortness of breath., Disp: 8 g, Rfl: 0   fexofenadine-pseudoephedrine (ALLEGRA-D 24) 180-240 MG 24 hr tablet, Take 1 tablet by mouth daily., Disp: , Rfl:    fluticasone  (FLOVENT  HFA) 220 MCG/ACT inhaler, 2 puffs by Other route 2 (two) times daily. Please swallow two puffs twice daily, Disp: 36 g, Rfl: 1   ipratropium (ATROVENT ) 0.03 % nasal spray, PLACE 2 SPRAYS INTO BOTH NOSTRILS EVERY 12 (TWELVE) HOURS., Disp: 30 mL, Rfl: 2   pantoprazole  (PROTONIX ) 40 MG tablet, Take 1 tablet (40 mg total) by mouth daily., Disp: 60 tablet, Rfl: 5   Medications ordered in this encounter:  Meds ordered this encounter  Medications   doxycycline  (VIBRA -TABS) 100 MG tablet    Sig: Take 1 tablet (100 mg total) by mouth 2 (two) times daily for 10 days.    Dispense:  20 tablet    Refill:  0    Supervising Provider:   LAMPTEY, PHILIP O [[id number]]     *If you need refills on other medications prior to your next appointment, please contact your  pharmacy*  Follow-Up: Call back or seek an in-person evaluation if the symptoms worsen or if the condition fails to improve as anticipated.  [facility] [phone]  Other Instructions Take the prescribed medication as directed Trial of Hibiclens solution, this can be bought over-the-counter at [facility] and/or online Monitor your symptoms, you may take a picture with your phone to track your progress and be able to show dermatologist when you go for your scheduled appointment    If you have been instructed to have an in-person evaluation today at a local Urgent Care facility, please use the link below. It will take you to a list of all of our available [facility], including address, phone number and hours of operation. Please do not delay care.  [facility]  If you or a family member do not have a primary care provider, use the link below to schedule a visit and establish care. When you choose a [facility] primary care physician or advanced practice provider, you gain a long-term partner in health. Find a Primary Care Provider  Learn more about [facility]'s in-office and virtual care options: [facility] - Get Care Now

## 2024-04-21 DIAGNOSIS — L739 Follicular disorder, unspecified: Secondary | ICD-10-CM | POA: Diagnosis not present
